# Patient Record
Sex: Female | Born: 1980 | Race: White | Hispanic: Yes | Marital: Single | Smoking: Never smoker
Health system: Southern US, Community
[De-identification: ages and names within clinical notes are randomized; demographics above are authoritative.]

## PROBLEM LIST (undated history)

## (undated) DIAGNOSIS — L409 Psoriasis, unspecified: Secondary | ICD-10-CM

## (undated) DIAGNOSIS — R519 Headache, unspecified: Secondary | ICD-10-CM

## (undated) DIAGNOSIS — Z87442 Personal history of urinary calculi: Secondary | ICD-10-CM

## (undated) HISTORY — DX: Headache, unspecified: R51.9

## (undated) HISTORY — DX: Personal history of urinary calculi: Z87.442

## (undated) HISTORY — DX: Psoriasis, unspecified: L40.9

## (undated) HISTORY — PX: OTHER SURGICAL HISTORY: SHX169

---

## 2004-06-29 ENCOUNTER — Emergency Department: Payer: Self-pay | Admitting: Emergency Medicine

## 2008-04-09 ENCOUNTER — Ambulatory Visit: Payer: Self-pay | Admitting: Family Medicine

## 2008-06-11 ENCOUNTER — Ambulatory Visit: Payer: Self-pay | Admitting: Surgery

## 2008-10-10 ENCOUNTER — Inpatient Hospital Stay: Payer: Self-pay | Admitting: Obstetrics and Gynecology

## 2010-07-27 ENCOUNTER — Emergency Department: Payer: Self-pay | Admitting: Internal Medicine

## 2013-05-24 ENCOUNTER — Ambulatory Visit: Payer: Self-pay | Admitting: Advanced Practice Midwife

## 2013-05-30 ENCOUNTER — Ambulatory Visit: Payer: Self-pay | Admitting: Advanced Practice Midwife

## 2013-09-18 ENCOUNTER — Inpatient Hospital Stay: Payer: Self-pay | Admitting: Obstetrics and Gynecology

## 2013-09-18 LAB — CBC WITH DIFFERENTIAL/PLATELET
BASOS ABS: 0.1 10*3/uL (ref 0.0–0.1)
Basophil %: 0.5 %
Eosinophil #: 0.1 10*3/uL (ref 0.0–0.7)
Eosinophil %: 0.7 %
HCT: 37.6 % (ref 35.0–47.0)
HGB: 12.7 g/dL (ref 12.0–16.0)
Lymphocyte #: 2.2 10*3/uL (ref 1.0–3.6)
Lymphocyte %: 19.8 %
MCH: 30.5 pg (ref 26.0–34.0)
MCHC: 33.6 g/dL (ref 32.0–36.0)
MCV: 91 fL (ref 80–100)
MONOS PCT: 7.6 %
Monocyte #: 0.8 x10 3/mm (ref 0.2–0.9)
NEUTROS PCT: 71.4 %
Neutrophil #: 7.8 10*3/uL — ABNORMAL HIGH (ref 1.4–6.5)
PLATELETS: 207 10*3/uL (ref 150–440)
RBC: 4.16 10*6/uL (ref 3.80–5.20)
RDW: 14.9 % — ABNORMAL HIGH (ref 11.5–14.5)
WBC: 10.9 10*3/uL (ref 3.6–11.0)

## 2013-09-19 LAB — HEMOGLOBIN: HGB: 11.3 g/dL — ABNORMAL LOW (ref 12.0–16.0)

## 2015-06-30 NOTE — L&D Delivery Note (Addendum)
Delivery Note EGA: 39.5 LMP: 08/23/15 EDC: 05/28/16  At 7:09, a viable female was delivered via spontaneous vaginal delivery (Presentation: LOA; cephalic).  APGAR: 9, 9; weight 8-9 .   Placenta status: sponatneous, intact.  Cord: 3 vessels,  with the following complications: none apparent.  Cord pH: not collected  Anesthesia:  none Episiotomy:  none Lacerations:  none Suture Repair: none Est. Blood Loss (mL): 100cc   Mom to postpartum.  Baby to Couplet care / Skin to Skin.  Mom presented with decreased fetal movement, and was found to have oligohydramnios with a total AFI of 2.2cm.  She was given a contraction stress test with no deleterious decelerations and was continued on pitocin for induction. She was AROM'd for clear fluid and in ~6730min was complete and pushed through 2 contractions to deliver.  Head followed by shoulders/body delivered without difficulty, and baby was placed on mom's chest.  Cord was doubly clamped and cut by FOB after a >60second delay, until pulseless.  Cord blood was collected and the placenta delivered spontaneously, intact.  IV pitocin was started for pph prophylaxis.  The baby was taken to the warmer as mom declined skin-to-skin.  I sang happy birthday to the as-of-yet-unnamed baby boy.  Chelsea C Ward 05/27/2016, 7:16 AM   Addendum:  Due to oligohydramnios, placenta sent to pathology

## 2016-01-20 ENCOUNTER — Other Ambulatory Visit: Payer: Self-pay | Admitting: Family Medicine

## 2016-01-20 DIAGNOSIS — Z3A36 36 weeks gestation of pregnancy: Secondary | ICD-10-CM

## 2016-01-20 DIAGNOSIS — Z3689 Encounter for other specified antenatal screening: Secondary | ICD-10-CM

## 2016-01-23 ENCOUNTER — Encounter: Payer: Self-pay | Admitting: Radiology

## 2016-01-23 ENCOUNTER — Ambulatory Visit
Admission: RE | Admit: 2016-01-23 | Discharge: 2016-01-23 | Disposition: A | Discharge status: Home / Self Care | Payer: Self-pay | Source: Ambulatory Visit | Attending: Family Medicine | Admitting: Family Medicine

## 2016-01-23 DIAGNOSIS — Z3A22 22 weeks gestation of pregnancy: Secondary | ICD-10-CM | POA: Insufficient documentation

## 2016-01-23 DIAGNOSIS — Z3689 Encounter for other specified antenatal screening: Secondary | ICD-10-CM

## 2016-01-23 DIAGNOSIS — Z36 Encounter for antenatal screening of mother: Secondary | ICD-10-CM | POA: Insufficient documentation

## 2016-03-09 LAB — OB RESULTS CONSOLE HIV ANTIBODY (ROUTINE TESTING): HIV: NONREACTIVE

## 2016-03-10 LAB — OB RESULTS CONSOLE RPR: RPR: NONREACTIVE

## 2016-05-07 LAB — OB RESULTS CONSOLE GBS: GBS: NEGATIVE

## 2016-05-20 LAB — OB RESULTS CONSOLE VARICELLA ZOSTER ANTIBODY, IGG: VARICELLA IGG: IMMUNE

## 2016-05-20 LAB — OB RESULTS CONSOLE RUBELLA ANTIBODY, IGM: RUBELLA: IMMUNE

## 2016-05-26 ENCOUNTER — Inpatient Hospital Stay
Admission: EM | Admit: 2016-05-26 | Discharge: 2016-05-28 | DRG: 775 | Disposition: A | Discharge status: Home / Self Care | Payer: Medicaid Other | Attending: Obstetrics & Gynecology | Admitting: Obstetrics & Gynecology

## 2016-05-26 DIAGNOSIS — Z3A39 39 weeks gestation of pregnancy: Secondary | ICD-10-CM

## 2016-05-26 DIAGNOSIS — O4100X0 Oligohydramnios, unspecified trimester, not applicable or unspecified: Secondary | ICD-10-CM | POA: Diagnosis present

## 2016-05-26 DIAGNOSIS — Z641 Problems related to multiparity: Secondary | ICD-10-CM

## 2016-05-26 DIAGNOSIS — O4103X0 Oligohydramnios, third trimester, not applicable or unspecified: Secondary | ICD-10-CM | POA: Diagnosis present

## 2016-05-26 DIAGNOSIS — O09529 Supervision of elderly multigravida, unspecified trimester: Secondary | ICD-10-CM

## 2016-05-26 DIAGNOSIS — O0993 Supervision of high risk pregnancy, unspecified, third trimester: Secondary | ICD-10-CM

## 2016-05-26 DIAGNOSIS — O368130 Decreased fetal movements, third trimester, not applicable or unspecified: Secondary | ICD-10-CM | POA: Diagnosis present

## 2016-05-26 LAB — CBC
HCT: 35.6 % (ref 35.0–47.0)
HEMOGLOBIN: 12.1 g/dL (ref 12.0–16.0)
MCH: 30.3 pg (ref 26.0–34.0)
MCHC: 34.1 g/dL (ref 32.0–36.0)
MCV: 88.8 fL (ref 80.0–100.0)
PLATELETS: 220 10*3/uL (ref 150–440)
RBC: 4.01 MIL/uL (ref 3.80–5.20)
RDW: 14.3 % (ref 11.5–14.5)
WBC: 9.6 10*3/uL (ref 3.6–11.0)

## 2016-05-26 LAB — TYPE AND SCREEN
ABO/RH(D): O POS
Antibody Screen: NEGATIVE

## 2016-05-26 MED ORDER — OXYTOCIN BOLUS FROM INFUSION
500.0000 mL | Freq: Once | INTRAVENOUS | Status: AC
  Administered 2016-05-27: 500 mL via INTRAVENOUS

## 2016-05-26 MED ORDER — SOD CITRATE-CITRIC ACID 500-334 MG/5ML PO SOLN: 30.0000 mL | ORAL | Status: DC | PRN

## 2016-05-26 MED ORDER — OXYTOCIN 40 UNITS IN LACTATED RINGERS INFUSION - SIMPLE MED
INTRAVENOUS | Status: AC
  Administered 2016-05-27: 500 mL via INTRAVENOUS
  Filled 2016-05-26: qty 1000

## 2016-05-26 MED ORDER — ONDANSETRON HCL 4 MG/2ML IJ SOLN: 4.0000 mg | Freq: Four times a day (QID) | INTRAMUSCULAR | Status: DC | PRN

## 2016-05-26 MED ORDER — LIDOCAINE HCL (PF) 1 % IJ SOLN
INTRAMUSCULAR | Status: AC
  Filled 2016-05-26: qty 30

## 2016-05-26 MED ORDER — OXYTOCIN 40 UNITS IN LACTATED RINGERS INFUSION - SIMPLE MED
1.0000 m[IU]/min | INTRAVENOUS | Status: DC
  Administered 2016-05-26: 1 m[IU]/min via INTRAVENOUS

## 2016-05-26 MED ORDER — AMMONIA AROMATIC IN INHA
RESPIRATORY_TRACT | Status: AC
  Filled 2016-05-26: qty 10

## 2016-05-26 MED ORDER — LIDOCAINE HCL (PF) 1 % IJ SOLN
30.0000 mL | INTRAMUSCULAR | Status: DC | PRN
Start: 2016-05-26 — End: 2016-05-28

## 2016-05-26 MED ORDER — LACTATED RINGERS IV SOLN: 500.0000 mL | INTRAVENOUS | Status: DC | PRN

## 2016-05-26 MED ORDER — BUTORPHANOL TARTRATE 1 MG/ML IJ SOLN: 1.0000 mg | INTRAMUSCULAR | Status: DC | PRN

## 2016-05-26 MED ORDER — ACETAMINOPHEN 325 MG PO TABS: 650.0000 mg | ORAL_TABLET | ORAL | Status: DC | PRN

## 2016-05-26 MED ORDER — LACTATED RINGERS IV SOLN
INTRAVENOUS | Status: DC
  Administered 2016-05-26 – 2016-05-27 (×2): via INTRAVENOUS

## 2016-05-26 MED ORDER — MISOPROSTOL 25 MCG QUARTER TABLET: 25.0000 ug | ORAL_TABLET | ORAL | Status: DC | PRN

## 2016-05-26 MED ORDER — TERBUTALINE SULFATE 1 MG/ML IJ SOLN
0.2500 mg | Freq: Once | INTRAMUSCULAR | Status: DC | PRN
Start: 2016-05-26 — End: 2016-05-28

## 2016-05-26 MED ORDER — OXYTOCIN 10 UNIT/ML IJ SOLN
INTRAMUSCULAR | Status: AC
  Filled 2016-05-26: qty 2

## 2016-05-26 MED ORDER — OXYTOCIN 40 UNITS IN LACTATED RINGERS INFUSION - SIMPLE MED
2.5000 [IU]/h | INTRAVENOUS | Status: DC
  Filled 2016-05-26: qty 1000

## 2016-05-26 MED ORDER — MISOPROSTOL 200 MCG PO TABS
ORAL_TABLET | ORAL | Status: AC
  Filled 2016-05-26: qty 4

## 2016-05-26 NOTE — H&P (Signed)
OB History & Physical   History of Present Illness:  Chief Complaint:   HPI:  Stacy Bauer is a 35 y.o. [id number] female at 1035w4d dated by LMP c/w 22wk US with EDC of 05/28/16.  She presents to L&D for decreased fetal movement x 2 days.     no CTX, no LOF, no VB  Pregnancy Issues: 1. AMA 2. Grand multiparity 3. Late entry to care 4. Elevated 1hr normal 3hr  Maternal Medical History:  No past medical history on file.  No past surgical history on file.  No Known Allergies  Prior to Admission medications   Not on File     [facility]   Social History: She denies etoh, tobacco, illicit drug use.  Lives at home with her 4 children and husband    Family History: family history is not on file. no GYN cancers  Review of Systems: A full review of systems was performed and negative except as noted in the HPI.     Physical Exam:  Vital Signs: Resp 20   Ht 5\' 1"  (1.549 m)   Wt 76.2 kg (168 lb)   BMI 31.74 kg/m  General: no acute distress.  HEENT: normocephalic, atraumatic Heart: regular rate & rhythm.  No murmurs/rubs/gallops Lungs: clear to auscultation bilaterally, normal respiratory effort Abdomen: soft, gravid, non-tender;  EFW: 7.8 Pelvic:   External: Normal external female genitalia  Cervix: Dilation: 3 / Effacement (%): 40 /   -3   Extremities: non-tender, symmetric, 1+ edema bilaterally.  DTRs: 2+ Neurologic: Alert & oriented x 3.    No results found for this or any previous visit (from the past 24 hour(s)).  Pertinent Results:  Prenatal Labs: Blood type/Rh O+  Antibody screen neg  Rubella Immune  Varicella Immune  RPR NR  HBsAg Neg  HIV NR  GC neg  Chlamydia neg  Genetic screening Negative quad screen  1 hour GTT 147  3 hour GTT WNL  GBS Neg 05/07/16  TDAP 03/09/16  FLU declined FHT:  140 mod + accels + variable decel x111min with return to baseline following repositioning TOCO: quiet SVE:  Dilation: 3 /  Effacement (%): 40 /   -3, posterior, medium   Ultrasound: Cephalic presentation AFI = 2.2cm Scant fluid seen in 3/4 quadrants, with cord in each fluid pocket; immeasurable quantity  Assessment:  Stacy Bauer is a 35 y.o. [phone] female at 2835w4d with oligohydramnios at term for IOL.   Plan:  1. Admit to Labor & Delivery 2. CBC, T&S, Clrs, IVF 3. GBS negative   4. Consents obtained. 5. Continuous efm/toco 6. Category 2 strip, no evidence for acidemia.   7. Contraction stress test with pitocin to start.  If baby tolerates/Category 1 strip, will consider cytotec.  Otherwise will induce with pitocin.  Discussed plan with patient and explained the risk of oligohydramnios carries with it a risk of emergency cesarean due to fetal distress.    ----- Ranae Plumberhelsea Zamani Crocker, MD Attending Obstetrician and Gynecologist [facility], Department of OB/GYN [facility]

## 2016-05-27 MED ORDER — TETANUS-DIPHTH-ACELL PERTUSSIS 5-2.5-18.5 LF-MCG/0.5 IM SUSP: 0.5000 mL | Freq: Once | INTRAMUSCULAR | Status: DC

## 2016-05-27 MED ORDER — IBUPROFEN 600 MG PO TABS
600.0000 mg | ORAL_TABLET | Freq: Four times a day (QID) | ORAL | Status: DC
  Administered 2016-05-27 – 2016-05-28 (×4): 600 mg via ORAL
  Filled 2016-05-27 (×4): qty 1

## 2016-05-27 MED ORDER — ACETAMINOPHEN 500 MG PO TABS
1000.0000 mg | ORAL_TABLET | Freq: Four times a day (QID) | ORAL | Status: DC | PRN
  Administered 2016-05-27: 1000 mg via ORAL
  Filled 2016-05-27: qty 2

## 2016-05-27 MED ORDER — IBUPROFEN 600 MG PO TABS
ORAL_TABLET | ORAL | Status: AC
  Administered 2016-05-27: 600 mg
  Filled 2016-05-27: qty 1

## 2016-05-27 MED ORDER — WITCH HAZEL-GLYCERIN EX PADS: 1.0000 "application " | MEDICATED_PAD | CUTANEOUS | Status: DC | PRN

## 2016-05-27 MED ORDER — PRENATAL MULTIVITAMIN CH
1.0000 | ORAL_TABLET | Freq: Every day | ORAL | Status: DC
  Administered 2016-05-27 – 2016-05-28 (×2): 1 via ORAL
  Filled 2016-05-27 (×2): qty 1

## 2016-05-27 MED ORDER — ONDANSETRON HCL 4 MG PO TABS: 4.0000 mg | ORAL_TABLET | ORAL | Status: DC | PRN

## 2016-05-27 MED ORDER — DOCUSATE SODIUM 100 MG PO CAPS
100.0000 mg | ORAL_CAPSULE | Freq: Two times a day (BID) | ORAL | Status: DC
  Administered 2016-05-27 – 2016-05-28 (×2): 100 mg via ORAL
  Filled 2016-05-27 (×2): qty 1

## 2016-05-27 MED ORDER — DIBUCAINE 1 % RE OINT: 1.0000 "application " | TOPICAL_OINTMENT | RECTAL | Status: DC | PRN

## 2016-05-27 MED ORDER — SIMETHICONE 80 MG PO CHEW: 80.0000 mg | CHEWABLE_TABLET | ORAL | Status: DC | PRN

## 2016-05-27 MED ORDER — INFLUENZA VAC SPLIT QUAD 0.5 ML IM SUSY: 0.5000 mL | PREFILLED_SYRINGE | INTRAMUSCULAR | Status: DC | PRN

## 2016-05-27 MED ORDER — COCONUT OIL OIL: 1.0000 "application " | TOPICAL_OIL | Status: DC | PRN

## 2016-05-27 MED ORDER — ONDANSETRON HCL 4 MG/2ML IJ SOLN: 4.0000 mg | INTRAMUSCULAR | Status: DC | PRN

## 2016-05-27 MED ORDER — DIPHENHYDRAMINE HCL 25 MG PO CAPS: 25.0000 mg | ORAL_CAPSULE | Freq: Four times a day (QID) | ORAL | Status: DC | PRN

## 2016-05-27 NOTE — Procedures (Signed)
Bedside ultrasound performed:  EGA: 39.4 days LMP: 08/23/15 EDC: 05/28/16 (per records)  Fetal position:  Cephalic, spine maternal right Placenta: anterior, fundal  AFI: Q1 left upper quadrant: 2.2cm Q2: right upper quadrant: 0cm Q3: right lowe quadrant: 0cm Q4: left lower quadrant: 0cm (small pockets, each containing cord)  Total: 2.2cm  Diagnosis: oligohydramnios at term  ----- Ranae Plumberhelsea Jelitza Manninen, MD Attending Obstetrician and Gynecologist [facility], Department of OB/GYN [facility]

## 2016-05-27 NOTE — Progress Notes (Signed)
Intrapartum progress note   S: comfortable, having stronger contractions.  O: Temp 98.1 F (36.7 C) (Oral)   Resp 18   Ht 5\' 1"  (1.549 m)   Wt 76.2 kg (168 lb)   BMI 31.74 kg/m   FHT: 135 mod + accels +early decelerations TOCO: q2-634min on pitocin @ 7112mu/min SVE: 5/90/-1 AROM'd for clear fluid  A/P: 35yo [id number] @ 39.5 with IOL for oligohydramnios at term  1. IUP: category 1 tracing 2. IOL: continue pitocin, titrate to q2-393min contractions  ----- Stacy Plumberhelsea Diana Davenport, MD Attending Obstetrician and Gynecologist [facility], Department of OB/GYN [facility]

## 2016-05-27 NOTE — Discharge Summary (Signed)
Obstetrical Discharge Summary  Patient Name: Stacy Bauer DOB: 1980/07/27 MRN: [id number]  Date of Admission: 05/26/2016 Date of Discharge: 05/28/16 Primary OB:  [facility]   Gestational Age at Delivery: 439w5d   Antepartum complications: AMA, grand multiparity, late entry to care, abnormal 1hr GTT, oligohydramnios  Admitting Diagnosis: IOL for oligohydramnios   Secondary Diagnosis: Patient Active Problem List   Diagnosis Date Noted  . Normal vaginal delivery of fifth pregnancy 05/27/2016  . Labor and delivery, indication for care 05/26/2016  . Oligohydramnios antepartum 05/26/2016  . High-risk pregnancy in third trimester 05/26/2016  . Advanced maternal age in multigravida 05/26/2016  . Grand multiparity 05/26/2016    Augmentation: AROM and Pitocin Complications: None Intrapartum complications/course: Mom presented with decreased fetal movement, and was found to have oligohydramnios with a total AFI of 2.2cm.  She was given a contraction stress test with no deleterious decelerations and was continued on pitocin for induction. She was AROM'd for clear fluid and in ~4030min was complete and pushed through 2 contractions to deliver.  Head followed by shoulders/body delivered without difficulty, and baby was placed on mom's chest.  Cord was doubly clamped and cut by FOB after a >60second delay, until pulseless.  Cord blood was collected and the placenta delivered spontaneously, intact.  IV pitocin was started for pph prophylaxis.  Date of Delivery: 05/27/2016 Delivered By: Stacy Bauer Delivery Type: spontaneous vaginal delivery Anesthesia: none Placenta: sponatneous Laceration: none Episiotomy: none Newborn Data: Live born female Birth Weight:  8lb 9oz APGAR: 9, 9    Postpartum Procedures: none  Post partum course:  Patient had an uncomplicated postpartum course.  By time of discharge on PPD#1, her pain was controlled on oral pain medications; she had appropriate lochia and  was ambulating, voiding without difficulty and tolerating regular diet.  She was deemed stable for discharge to home.     Discharge Physical Exam: 05/28/16 Temp 98.2 F (36.8 C)   Resp 17   Ht 5\' 1"  (1.549 m)   Wt 76.2 kg (168 lb)   BMI 31.74 kg/m   General: NAD CV: RRR Pulm: CTABL, nl effort ABD: s/nd/nt, fundus firm and below the umbilicus Lochia: moderate DVT Evaluation: LE non-ttp, no evidence of DVT on exam.  Hemoglobin  Date Value Ref Range Status  05/26/2016 12.1 12.0 - 16.0 g/dL Final   HGB  Date Value Ref Range Status  09/19/2013 11.3 (L) 12.0 - 16.0 g/dL Final   HCT  Date Value Ref Range Status  05/26/2016 35.6 35.0 - 47.0 % Final  09/18/2013 37.6 35.0 - 47.0 % Final     Disposition: stable, discharge to home. Baby Feeding: breastmilk andformula Baby Disposition: home with mom  Rh Immune globulin given: n/a Rubella vaccine given: n/a Tdap vaccine given in AP or PP setting: AP Flu vaccine given in AP or PP setting: declined  Contraception: Paraguard  Prenatal Labs:  Blood type/Rh O+  Antibody screen neg  Rubella Immune  Varicella Immune  RPR NR  HBsAg Neg  HIV NR  GC neg  Chlamydia neg  Genetic screening Negative quad screen  1 hour GTT 147  3 hour GTT WNL  GBS Neg 05/07/16  TDAP 03/09/16  FLU declined    Plan:  Stacy Bauer was discharged to home in good condition. Follow-up appointment at [facility] in 6 week and Paragard IUD.    Discharge Medications:   Medication List    TAKE these medications   ibuprofen 600 MG tablet Commonly known as:  ADVIL,MOTRIN  Take 1 tablet (600 mg total) by mouth every 6 (six) hours.      Discharge Instructions    Activity as tolerated    Complete by:  As directed    Call MD for:    Complete by:  As directed    Signs and symptoms of postpartum depression.  If you develop suicidal or homicidal thoughts, go to ED.   Call MD for:  difficulty breathing, headache or visual disturbances    Complete  by:  As directed    Call MD for:  extreme fatigue    Complete by:  As directed    Call MD for:  hives    Complete by:  As directed    Call MD for:  persistant dizziness or light-headedness    Complete by:  As directed    Call MD for:  persistant nausea and vomiting    Complete by:  As directed    Call MD for:  redness, tenderness, or signs of infection (pain, swelling, redness, odor or green/yellow discharge around incision site)    Complete by:  As directed    Call MD for:  severe uncontrolled pain    Complete by:  As directed    Call MD for:  temperature >100.4    Complete by:  As directed    Diet - low sodium heart healthy    Complete by:  As directed    Sexual acrtivity    Complete by:  As directed    NO intercourse until 6 weeks Postpartum after IUD placement       Signed: Carlean JewsMeredith Sigmon, CNM

## 2016-05-27 NOTE — Progress Notes (Signed)
[facility] With ambulation to the BR for post delivery void.  Pt. Voided moderate amount of red tinge urine, pericare given. Pt. And infant ready for transfer to [facility] room #336. Infant transferred via basinet and patient transferred via WC.

## 2016-05-27 NOTE — Progress Notes (Signed)
Patient has declined need for interpreter all day .

## 2016-05-27 NOTE — Progress Notes (Signed)
Pt. Currently eating regular diet.

## 2016-05-27 NOTE — Discharge Instructions (Signed)

## 2016-05-28 LAB — CBC
HCT: 30.9 % — ABNORMAL LOW (ref 35.0–47.0)
Hemoglobin: 10.8 g/dL — ABNORMAL LOW (ref 12.0–16.0)
MCH: 32.1 pg (ref 26.0–34.0)
MCHC: 34.8 g/dL (ref 32.0–36.0)
MCV: 92.2 fL (ref 80.0–100.0)
PLATELETS: 182 10*3/uL (ref 150–440)
RBC: 3.35 MIL/uL — ABNORMAL LOW (ref 3.80–5.20)
RDW: 14.2 % (ref 11.5–14.5)
WBC: 11.4 10*3/uL — ABNORMAL HIGH (ref 3.6–11.0)

## 2016-05-28 LAB — SURGICAL PATHOLOGY

## 2016-05-28 LAB — RPR: RPR Ser Ql: NONREACTIVE

## 2016-05-28 MED ORDER — IBUPROFEN 600 MG PO TABS: 600.0000 mg | ORAL_TABLET | Freq: Four times a day (QID) | ORAL | 0 refills | Status: DC

## 2016-05-28 NOTE — Progress Notes (Signed)
Pt discharged home with infant.  Discharge instructions and follow up appointment given to and reviewed with pt.  Pt verbalized understanding.  Escorted by auxillary. 

## 2016-06-24 ENCOUNTER — Emergency Department
Admission: EM | Admit: 2016-06-24 | Discharge: 2016-06-24 | Disposition: A | Discharge status: Home / Self Care | Payer: No Typology Code available for payment source | Attending: Emergency Medicine | Admitting: Emergency Medicine

## 2016-06-24 ENCOUNTER — Encounter: Payer: Self-pay | Admitting: Emergency Medicine

## 2016-06-24 ENCOUNTER — Emergency Department: Payer: No Typology Code available for payment source

## 2016-06-24 DIAGNOSIS — F0781 Postconcussional syndrome: Secondary | ICD-10-CM | POA: Insufficient documentation

## 2016-06-24 DIAGNOSIS — S0990XA Unspecified injury of head, initial encounter: Secondary | ICD-10-CM | POA: Diagnosis present

## 2016-06-24 DIAGNOSIS — R51 Headache: Secondary | ICD-10-CM | POA: Insufficient documentation

## 2016-06-24 DIAGNOSIS — Y9389 Activity, other specified: Secondary | ICD-10-CM | POA: Insufficient documentation

## 2016-06-24 DIAGNOSIS — Y999 Unspecified external cause status: Secondary | ICD-10-CM | POA: Insufficient documentation

## 2016-06-24 DIAGNOSIS — Y92410 Unspecified street and highway as the place of occurrence of the external cause: Secondary | ICD-10-CM | POA: Diagnosis not present

## 2016-06-24 LAB — POCT PREGNANCY, URINE: Preg Test, Ur: NEGATIVE

## 2016-06-24 MED ORDER — METOCLOPRAMIDE HCL 10 MG PO TABS
10.0000 mg | ORAL_TABLET | Freq: Once | ORAL | Status: AC
  Administered 2016-06-24: 10 mg via ORAL
  Filled 2016-06-24: qty 1

## 2016-06-24 MED ORDER — ACETAMINOPHEN-CODEINE #3 300-30 MG PO TABS
1.0000 | ORAL_TABLET | Freq: Once | ORAL | Status: AC
  Administered 2016-06-24: 1 via ORAL
  Filled 2016-06-24: qty 1

## 2016-06-24 MED ORDER — METOCLOPRAMIDE HCL 5 MG PO TABS: 5.0000 mg | ORAL_TABLET | Freq: Three times a day (TID) | ORAL | 0 refills | Status: DC | PRN

## 2016-06-24 MED ORDER — ACETAMINOPHEN-CODEINE #3 300-30 MG PO TABS: 1.0000 | ORAL_TABLET | Freq: Three times a day (TID) | ORAL | 0 refills | Status: DC | PRN

## 2016-06-24 MED ORDER — DIPHENHYDRAMINE HCL 25 MG PO CAPS
25.0000 mg | ORAL_CAPSULE | Freq: Once | ORAL | Status: AC
  Administered 2016-06-24: 25 mg via ORAL
  Filled 2016-06-24: qty 1

## 2016-06-24 MED ORDER — NAPROXEN 500 MG PO TABS
500.0000 mg | ORAL_TABLET | Freq: Once | ORAL | Status: AC
  Administered 2016-06-24: 500 mg via ORAL
  Filled 2016-06-24: qty 1
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
## 2020-01-24 ENCOUNTER — Encounter: Payer: Self-pay | Admitting: Emergency Medicine

## 2020-01-24 ENCOUNTER — Other Ambulatory Visit: Payer: Self-pay

## 2020-01-24 ENCOUNTER — Ambulatory Visit
Admission: EM | Admit: 2020-01-24 | Discharge: 2020-01-24 | Disposition: A | Discharge status: Home / Self Care | Payer: BC Managed Care – PPO | Attending: Emergency Medicine | Admitting: Emergency Medicine

## 2020-01-24 DIAGNOSIS — B3731 Acute candidiasis of vulva and vagina: Secondary | ICD-10-CM

## 2020-01-24 DIAGNOSIS — B9689 Other specified bacterial agents as the cause of diseases classified elsewhere: Secondary | ICD-10-CM | POA: Diagnosis present

## 2020-01-24 DIAGNOSIS — B373 Candidiasis of vulva and vagina: Secondary | ICD-10-CM | POA: Diagnosis not present

## 2020-01-24 DIAGNOSIS — N200 Calculus of kidney: Secondary | ICD-10-CM | POA: Diagnosis present

## 2020-01-24 DIAGNOSIS — N760 Acute vaginitis: Secondary | ICD-10-CM | POA: Diagnosis present

## 2020-01-24 LAB — URINALYSIS, COMPLETE (UACMP) WITH MICROSCOPIC
Bacteria, UA: NONE SEEN
Bilirubin Urine: NEGATIVE
Glucose, UA: NEGATIVE mg/dL
Ketones, ur: NEGATIVE mg/dL
Nitrite: NEGATIVE
Protein, ur: NEGATIVE mg/dL
Specific Gravity, Urine: 1.025 (ref 1.005–1.030)
pH: 6 (ref 5.0–8.0)

## 2020-01-24 LAB — WET PREP, GENITAL
Sperm: NONE SEEN
Trich, Wet Prep: NONE SEEN

## 2020-01-24 MED ORDER — FLUCONAZOLE 150 MG PO TABS: 150.0000 mg | ORAL_TABLET | Freq: Once | ORAL | 1 refills | Status: AC

## 2020-01-24 MED ORDER — TAMSULOSIN HCL 0.4 MG PO CAPS: 0.4000 mg | ORAL_CAPSULE | Freq: Every day | ORAL | 0 refills | Status: AC

## 2020-01-24 MED ORDER — METRONIDAZOLE 500 MG PO TABS: 500.0000 mg | ORAL_TABLET | Freq: Two times a day (BID) | ORAL | 0 refills | Status: AC

## 2020-01-24 MED ORDER — IBUPROFEN 600 MG PO TABS: 600.0000 mg | ORAL_TABLET | Freq: Four times a day (QID) | ORAL | 0 refills | Status: AC | PRN

## 2020-01-24 NOTE — ED Triage Notes (Signed)
Patient c/o back pain that started 1 week ago. She also reports hematuria.

## 2020-01-24 NOTE — ED Provider Notes (Signed)
HPI  SUBJECTIVE:  Stacy Bauer is a 39 y.o. female who presents with constant, squeezing, throbbing left low back pain that radiates into her suprapubic region for the past week.  She states that she occasionally has right-sided low back pain.  She reports urinary frequency, odorous urine.  An episode of hematuria yesterday.  She states that she is seeing particles similar to sand in her urine.  No nausea,, fevers, dysuria, urinary urgency, cloudy urine.  She also reports vaginal itching, occasional labial erythema, edema secondary to the itching, and white vaginal discharge.  No vaginal bleeding, odor, rash.  She states that she has not had intercourse since September 2020.  No Aggravating or alleviating factors.  She has not tried anything for this.  No recent antibiotics.  She has never had back pain or vaginal discharge like this in the past.  Past medical history negative for UTI, pyelonephritis, nephrolithiasis, gonorrhea, chlamydia, HIV, HSV, syphilis, trichomonas, BV, yeast, diabetes, hypertension, PID.  LMP: 7/1.  Denies the possibility of being pregnant.  PMD: None.   History reviewed. No pertinent past medical history.  History reviewed. No pertinent surgical history.  History reviewed. No pertinent family history.  Social History   Tobacco Use  . Smoking status: Never Smoker  . Smokeless tobacco: Never Used  Substance Use Topics  . Alcohol use: No  . Drug use: No    No current facility-administered medications for this encounter.  Current Outpatient Medications:  .  fluconazole (DIFLUCAN) 150 MG tablet, Take 1 tablet (150 mg total) by mouth once for 1 dose. 1 tab po x 1. May repeat in 72 hours if no improvement, Disp: 2 tablet, Rfl: 1 .  ibuprofen (ADVIL) 600 MG tablet, Take 1 tablet (600 mg total) by mouth every 6 (six) hours as needed., Disp: 30 tablet, Rfl: 0 .  metroNIDAZOLE (FLAGYL) 500 MG tablet, Take 1 tablet (500 mg total) by mouth 2 (two) times daily for 7  days., Disp: 14 tablet, Rfl: 0 .  tamsulosin (FLOMAX) 0.4 MG CAPS capsule, Take 1 capsule (0.4 mg total) by mouth at bedtime for 7 days., Disp: 7 capsule, Rfl: 0  No Known Allergies   ROS  As noted in HPI.   Physical Exam  BP (!) 135/100 (BP Location: Right Arm)   Pulse 88   Temp 98 F (36.7 C) (Oral)   Resp 18   Ht 5\' 1"  (1.549 m)   Wt 72.6 kg   LMP 01/03/2020   SpO2 97%   BMI 30.23 kg/m   Constitutional: Well developed, well nourished, no acute distress Eyes:  EOMI, conjunctiva normal bilaterally HENT: Normocephalic, atraumatic,mucus membranes moist Respiratory: Normal inspiratory effort Cardiovascular: Normal rate GI: nondistended soft.  Positive suprapubic, left UVJ tenderness.  Right flank tenderness.  No right lower quadrant, left lower quadrant tenderness.  Active bowel sounds.  No guarding, rebound. Back: Left CVA tenderness skin: No rash, skin intact Musculoskeletal: no deformities Neurologic: Alert & oriented x 3, no focal neuro deficits Psychiatric: Speech and behavior appropriate   ED Course   Medications - No data to display  Orders Placed This Encounter  Procedures  . Wet prep, genital    Standing Status:   Standing    Number of Occurrences:   1  . Urinalysis, Complete w Microscopic    Standing Status:   Standing    Number of Occurrences:   1  . Strain all urine    Send pt home with urine strainer  Results for orders placed or performed during the hospital encounter of 01/24/20 (from the past 24 hour(s))  Urinalysis, Complete w Microscopic     Status: Abnormal   Collection Time: 01/24/20  3:34 PM  Result Value Ref Range   Color, Urine YELLOW YELLOW   APPearance CLEAR CLEAR   Specific Gravity, Urine 1.025 1.005 - 1.030   pH 6.0 5.0 - 8.0   Glucose, UA NEGATIVE NEGATIVE mg/dL   Hgb urine dipstick MODERATE (A) NEGATIVE   Bilirubin Urine NEGATIVE NEGATIVE   Ketones, ur NEGATIVE NEGATIVE mg/dL   Protein, ur NEGATIVE NEGATIVE mg/dL    Nitrite NEGATIVE NEGATIVE   Leukocytes,Ua TRACE (A) NEGATIVE   Squamous Epithelial / LPF 6-10 0 - 5   WBC, UA 0-5 0 - 5 WBC/hpf   RBC / HPF 6-10 0 - 5 RBC/hpf   Bacteria, UA NONE SEEN NONE SEEN  Wet prep, genital     Status: Abnormal   Collection Time: 01/24/20  4:09 PM   Specimen: Vaginal; Genital  Result Value Ref Range   Yeast Wet Prep HPF POC PRESENT (A) NONE SEEN   Trich, Wet Prep NONE SEEN NONE SEEN   Clue Cells Wet Prep HPF POC PRESENT (A) NONE SEEN   WBC, Wet Prep HPF POC FEW (A) NONE SEEN   Sperm NONE SEEN    No results found.  ED Clinical Impression  1. Nephrolithiasis   2. BV (bacterial vaginosis)   3. Vaginal yeast infection      ED Assessment/Plan  H&P, UA consistent with nephrolithiasis.  Do not think that this is an obstructing nephrolithiasis as she states that she is passing stones and appears comfortable.  She is afebrile, has not taken antipyretic in the past 6 hours and reports no history of fevers.  Therefore deferring imaging today.  This was a contaminated specimen, there are no bacteria in it, so do not think that this needs to go off for culture.  She is also reporting vaginal discharge, so will get a wet prep.  Do not think that we need to test for  gonorrhea chlamydia as she states that she has not been sexually active since September 2020.  Home with Tylenol/ibuprofen, Flomax, strainer.  Urology follow-up as she currently has no PMD.  Dr. Apolinar Junes on call.  Wet prep also positive for BV and yeast.  Will send home with Flagyl and Diflucan.   will also provide a primary care list for ongoing care.  Discussed labs,  MDM, treatment plan, and plan for follow-up with patient. Discussed sn/sx that should prompt return to the ED. patient agrees with plan.   Meds ordered this encounter  Medications  . metroNIDAZOLE (FLAGYL) 500 MG tablet    Sig: Take 1 tablet (500 mg total) by mouth 2 (two) times daily for 7 days.    Dispense:  14 tablet    Refill:  0  .  fluconazole (DIFLUCAN) 150 MG tablet    Sig: Take 1 tablet (150 mg total) by mouth once for 1 dose. 1 tab po x 1. May repeat in 72 hours if no improvement    Dispense:  2 tablet    Refill:  1  . tamsulosin (FLOMAX) 0.4 MG CAPS capsule    Sig: Take 1 capsule (0.4 mg total) by mouth at bedtime for 7 days.    Dispense:  7 capsule    Refill:  0  . ibuprofen (ADVIL) 600 MG tablet    Sig: Take 1 tablet (600 mg  total) by mouth every 6 (six) hours as needed.    Dispense:  30 tablet    Refill:  0    *This clinic note was created using Scientist, clinical (histocompatibility and immunogenetics). Therefore, there may be occasional mistakes despite careful proofreading.   ?    Domenick Gong, MD 01/26/20 [phone]

## 2020-01-24 NOTE — Discharge Instructions (Addendum)
I suspect that you have kidney stones causing your back pain and the blood in your urine.  You may take 600 mg of ibuprofen combined with 1000 mg of Tylenol 3-4 times a day as needed for pain.  Drink extra fluids.  Strain all your urine.  The Flomax will help you pass any additional stones.  Follow-up with urology within the week to make sure that you are getting better.  Go immediately to the ER for fevers above 100.4, pain not controlled with Tylenol/ibuprofen, or any other concerns.  You also have BV and a vaginal yeast infection.  I am treating the BV with Flagyl and the yeast infection with Diflucan.  Make sure you finish all the medication, even if you feel better.  Here is a list of primary care providers who are taking new patients:  Dr. Elizabeth Sauer [address] [place] [address] [phone]  [facility] [address] [place], [place] [address] [phone]  [facility] [place] [address] [place] [address]  [phone]  [facility] [address] [place], [place] [address] [phone]  [facility] [address]  [phone] [place], [place] [address]  Go to www.goodrx.com to look up your medications. This will give you a list of where you can find your prescriptions at the most affordable prices. Or ask the pharmacist what the cash price is, or if they have any other discount programs available to help make your medication more affordable. This can be less expensive than what you would pay with insurance.

## 2020-08-07 ENCOUNTER — Encounter: Payer: Self-pay | Admitting: Advanced Practice Midwife

## 2020-08-07 ENCOUNTER — Other Ambulatory Visit: Payer: Self-pay

## 2020-08-07 ENCOUNTER — Ambulatory Visit (LOCAL_COMMUNITY_HEALTH_CENTER): Payer: Self-pay | Admitting: Advanced Practice Midwife

## 2020-08-07 VITALS — BP 121/77 | HR 60 | Ht 64.0 in | Wt 162.4 lb

## 2020-08-07 DIAGNOSIS — E663 Overweight: Secondary | ICD-10-CM

## 2020-08-07 DIAGNOSIS — Z30431 Encounter for routine checking of intrauterine contraceptive device: Secondary | ICD-10-CM

## 2020-08-07 DIAGNOSIS — Z3009 Encounter for other general counseling and advice on contraception: Secondary | ICD-10-CM

## 2020-08-07 LAB — WET PREP FOR TRICH, YEAST, CLUE: Trichomonas Exam: NEGATIVE

## 2020-08-07 MED ORDER — CLOTRIMAZOLE 1 % VA CREA: 1.0000 | TOPICAL_CREAM | Freq: Every day | VAGINAL | 0 refills | Status: DC

## 2020-08-07 NOTE — Progress Notes (Signed)
Presents for PE and PAP. Reports vaginal symptoms. Wet Prep results reviewed. Treated for yeast per verbal order by E. Sciora. Sharlyne Pacas, RN

## 2020-08-07 NOTE — Addendum Note (Signed)
Addended by: Sharlyne Pacas on: 08/07/2020 04:56 PM   Modules accepted: Orders

## 2020-08-07 NOTE — Progress Notes (Signed)
[facility] [address] Main Number: [phone]    Family Planning Visit- Initial Visit  Subjective:  Stacy Bauer is a 40 y.o. SHF [id number](22,17,11,6,4) nonsmoker   being seen today for an initial well woman visit and to discuss family planning options.  She is currently using IUD Mirena for pregnancy prevention. Patient reports she does not want a pregnancy in the next year.  Patient has the following medical conditions has Overweight BMI=27.8 on their problem list.  Chief Complaint  Patient presents with  . Gynecologic Exam    Patient reports last pap 07/29/2015 ASCUS HPV neg with no f/u.  Mirena inserted 07/07/16.  Denies cigs, vaping.  Last ETOH 07/04/20 (2 beers) q 2 mo. Not working. Living with 5 children.  Last sex 2 years ago.  LMP 07/12/20.  Patient denies cigs, vaping, MJ.  Body mass index is 27.88 kg/m. - Patient is eligible for diabetes screening based on BMI and age >45?  not applicable HA1C ordered? not applicable  Patient reports 0  partner/s in last year. Desires STI screening?  Yes  Has patient been screened once for HCV in the past?  No  No results found for: HCVAB  Does the patient have current drug use (including MJ), have a partner with drug use, and/or has been incarcerated since last result? No  If yes-- Screen for HCV through [facility]   Does the patient meet criteria for HBV testing? No  Criteria:  -Household, sexual or needle sharing contact with HBV -History of drug use -HIV positive -Those with known Hep C   Health Maintenance Due  Topic Date Due  . Hepatitis C Screening  Never done  . COVID-19 Vaccine (1) Never done  . HIV Screening  Never done  . TETANUS/TDAP  Never done  . PAP SMEAR-Modifier  Never done  . INFLUENZA VACCINE  Never done    Review of Systems  Eyes: Positive for blurred vision (intermittently 1x/mo onset last summer; never had eye exam--urged to go).   All other systems reviewed and are negative.   The following portions of the patient's history were reviewed and updated as appropriate: allergies, current medications, past family history, past medical history, past social history, past surgical history and problem list. Problem list updated.   See flowsheet for other program required questions.  Objective:   Vitals:   08/07/20 1500  BP: 121/77  Pulse: 60  Weight: 162 lb 6.4 oz (73.7 kg)  Height: 5\' 4"  (1.626 m)    Physical Exam Constitutional:      Appearance: Normal appearance. She is normal weight.  HENT:     Head: Normocephalic and atraumatic.     Mouth/Throat:     Mouth: Mucous membranes are moist.  Eyes:     Conjunctiva/sclera: Conjunctivae normal.  Neck:     Comments: Thyroid without masses or tenderness Cardiovascular:     Rate and Rhythm: Normal rate and regular rhythm.  Pulmonary:     Effort: Pulmonary effort is normal.     Breath sounds: Normal breath sounds.  Chest:  Breasts:     Right: Normal.     Left: Normal.    Abdominal:     Palpations: Abdomen is soft.     Comments: Soft fair tone without masses or tenderness  Genitourinary:    General: Normal vulva.     Exam position: Lithotomy position.     Vagina: Vaginal discharge (thick clumpy white leukorrhea, ph<4.5)  present.     Cervix: Friability (friable to pap; IUD string visible) present.     Uterus: Normal.      Adnexa: Right adnexa normal and left adnexa normal.     Rectum: Normal.  Musculoskeletal:        General: Normal range of motion.     Cervical back: Normal range of motion and neck supple.  Skin:    General: Skin is warm and dry.  Neurological:     Mental Status: She is alert.  Psychiatric:        Mood and Affect: Mood normal.       Assessment and Plan:  Stacy Bauer is a 40 y.o. female presenting to the [facility] Department for an initial well woman exam/family planning visit  Contraception counseling:  Reviewed all forms of birth control options in the tiered based approach. available including abstinence; over the counter/barrier methods; hormonal contraceptive medication including pill, patch, ring, injection,contraceptive implant, ECP; hormonal and nonhormonal IUDs; permanent sterilization options including vasectomy and the various tubal sterilization modalities. Risks, benefits, and typical effectiveness rates were reviewed.  Questions were answered.  Written information was also given to the patient to review.  Patient desires continuation of Mirena, this was prescribed for patient. She will follow up in  1 year for surveillance.  She was told to call with any further questions, or with any concerns about this method of contraception.  Emphasized use of condoms 100% of the time for STI prevention.  Patient was not offered ECP. ECP was not accepted by the patient. ECP counseling was not given - see RN documentation  1. Overweight BMI=27.8   2. Family planning Treat for yeast per standing orders please Treat wet mount per standing orders Immunization nurse consult - WET PREP FOR TRICH, YEAST, CLUE - IGP, Aptima HPV - Chlamydia/Gonorrhea [facility]  3. Encounter for routine checking of intrauterine contraceptive device (IUD) IUD in situ     No follow-ups on file.  No future appointments.  Alberteen Spindle, CNM

## 2020-08-08 ENCOUNTER — Telehealth: Payer: Self-pay

## 2020-08-08 NOTE — Telephone Encounter (Signed)
Per spanish interpreter erica id [phone] patient had moderna vaccine 1st dose on 02-07-2020, 2nd dose 03-07-2020 and moderna booster on 08-07-2020

## 2020-08-13 LAB — IGP, APTIMA HPV
HPV Aptima: NEGATIVE
PAP Smear Comment: 0

## 2020-08-14 ENCOUNTER — Encounter: Payer: Self-pay | Admitting: Advanced Practice Midwife

## 2020-08-14 DIAGNOSIS — R87619 Unspecified abnormal cytological findings in specimens from cervix uteri: Secondary | ICD-10-CM

## 2020-08-14 HISTORY — DX: Unspecified abnormal cytological findings in specimens from cervix uteri: R87.619

## 2021-07-10 ENCOUNTER — Other Ambulatory Visit: Payer: Self-pay

## 2021-07-10 ENCOUNTER — Ambulatory Visit (LOCAL_COMMUNITY_HEALTH_CENTER): Payer: Self-pay | Admitting: Family Medicine

## 2021-07-10 VITALS — BP 136/83 | Ht 64.0 in | Wt 166.8 lb

## 2021-07-10 DIAGNOSIS — L400 Psoriasis vulgaris: Secondary | ICD-10-CM

## 2021-07-10 DIAGNOSIS — Z3009 Encounter for other general counseling and advice on contraception: Secondary | ICD-10-CM

## 2021-07-10 MED ORDER — METHYLPREDNISOLONE 4 MG PO TBPK: ORAL_TABLET | Freq: Every day | ORAL | 1 refills | Status: DC

## 2021-07-10 NOTE — Progress Notes (Signed)
S: Pt in clinic for IUD removal.  Pt Mirena IUD was placed in 2018, pt was informed that she was able to keep IUD for 2 more years.  Pt is agreement of keeping IUD.   O: On exam patient appears to have psoriasis.  Varies size plaques over head,face, arms abdomen, back and legs. (See pictures in media).   A/P: 1. Plaque psoriasis  - methylPREDNISolone (MEDROL DOSEPAK) 4 MG TBPK tablet; Take by mouth daily. Take as directed  Dispense: 1 each; Refill: 1  Consulted K.Ernestina Patches, MD on generalized blotches of various sizes on patient body.  Patient reports that blotches appeared in July.  Patient reports itching.    Discussed with patient about psoriasis and need for PC.   [facility] and instructed with OTC treatment's that can be used to help relieve treatment.    Pt . Verbalized understanding.    Junious Dresser, FNP

## 2021-07-10 NOTE — Progress Notes (Signed)
Mirena IUD placed 06/30/2016.  Pt informed that the Mirena is good x 2 more years.  Pt has decided to keep her Mirena until 2025, however, she would like a Provider to look at a rash that covers her body

## 2021-07-25 NOTE — Progress Notes (Addendum)
I was consulted due the patient's rash. Findings are most consistent with psoriasis which on further question the patient has several family members with this diagnosis.  See images below. Recommended OTC treatments and PCP care given manifestation. Rx sent for medrol dose pack to see if this burst helps with systemic rash.

## 2021-10-02 ENCOUNTER — Telehealth: Payer: Self-pay

## 2021-10-02 NOTE — Telephone Encounter (Signed)
Telephone call to patient today to schedule her repeat PAP and PE due 07/2021.  Left message to return my call at [phone].  Language Line used.  Maggie Schwalbe / [phone] Hart Carwin, RN ? ?

## 2021-10-15 NOTE — Telephone Encounter (Signed)
Telephone call to patient this afternoon regarding her repeat PAP and PE due February 2023.  Voicemail full, but could send text per message on phone.  Text sent in Spanish staing the same thing the PAP letter says. Language Line used today.  Jesus / [phone] Hart Carwin, RN ? ?

## 2021-10-17 NOTE — Telephone Encounter (Signed)
Will close to PAP f/u due to no response to PAP letter sent, text and message left 10-02-2021.  Will await initiation of patient contacting [facility]. Hart Carwin, RN ? ?

## 2021-11-10 ENCOUNTER — Ambulatory Visit (LOCAL_COMMUNITY_HEALTH_CENTER): Payer: Self-pay | Admitting: Nurse Practitioner

## 2021-11-10 ENCOUNTER — Encounter: Payer: Self-pay | Admitting: Nurse Practitioner

## 2021-11-10 VITALS — BP 148/92 | HR 72 | Ht 64.0 in | Wt 167.0 lb

## 2021-11-10 DIAGNOSIS — F32A Depression, unspecified: Secondary | ICD-10-CM

## 2021-11-10 DIAGNOSIS — Z01419 Encounter for gynecological examination (general) (routine) without abnormal findings: Secondary | ICD-10-CM

## 2021-11-10 DIAGNOSIS — Z3009 Encounter for other general counseling and advice on contraception: Secondary | ICD-10-CM

## 2021-11-10 DIAGNOSIS — Z113 Encounter for screening for infections with a predominantly sexual mode of transmission: Secondary | ICD-10-CM

## 2021-11-10 LAB — WET PREP FOR TRICH, YEAST, CLUE
Trichomonas Exam: NEGATIVE
Yeast Exam: NEGATIVE

## 2021-11-10 LAB — HM HIV SCREENING LAB: HM HIV Screening: NEGATIVE

## 2021-11-10 NOTE — Progress Notes (Signed)
[facility] DEPARTMENT ?Family Planning Clinic ?[address] [employer] ?Main Number: [phone] ? ? ? ?Family Planning Visit- Initial Visit ? ?Subjective:  ?Stacy Bauer is a 41 y.o.  [phone]   being seen today for an initial annual visit and to discuss reproductive life planning.  The patient is currently using IUD or IUS for pregnancy prevention. Patient reports   does not want a pregnancy in the next year.   ? ? report they are looking for a method that provides High efficacy at preventing pregnancy ? ?Patient has the following medical conditions has Overweight BMI=27.8 and Abnormal Pap smear of cervix on their problem list. ? ?Chief Complaint  ?Patient presents with  ? Annual Exam  ? ? ?Patient reports to clinic today for a physical.   ? ?  ? ?Body mass index is 28.67 kg/m?. - Patient is eligible for diabetes screening based on BMI and age >73?  yes ?HA1C ordered? yes ? ?Patient reports 1  partner/s in last year. Desires STI screening?  Yes ? ?Has patient been screened once for HCV in the past?  No ? No results found for: HCVAB ? ?Does the patient have current drug use (including MJ), have a partner with drug use, and/or has been incarcerated since last result? No  ?If yes-- Screen for HCV through [facility] ?  ?Does the patient meet criteria for HBV testing? No ? ?Criteria:  ?-Household, sexual or needle sharing contact with HBV ?-History of drug use ?-HIV positive ?-Those with known Hep C ? ? ?Health Maintenance Due  ?Topic Date Due  ? COVID-19 Vaccine (1) Never done  ? Hepatitis C Screening  Never done  ? PAP SMEAR-Modifier  08/07/2021  ? ? ?Review of Systems  ?Constitutional:  Negative for chills, fever, malaise/fatigue and weight loss.  ?HENT:  Negative for congestion, hearing loss and sore throat.   ?Eyes:  Negative for blurred vision, double vision and photophobia.  ?     Yellowish eyes  ?Respiratory:  Negative for shortness of breath.   ?Cardiovascular:  Negative for chest  pain.  ?Gastrointestinal:  Positive for nausea. Negative for abdominal pain, blood in stool, constipation, diarrhea, heartburn and vomiting.  ?Genitourinary:  Negative for dysuria and frequency.  ?     Vaginal dishcarge  ?Musculoskeletal:  Negative for back pain, joint pain and neck pain.  ?Skin:  Positive for rash. Negative for itching.  ?     History of psoriasis   ?Neurological:  Negative for dizziness, weakness and headaches.  ?Endo/Heme/Allergies:  Does not bruise/bleed easily.  ?Psychiatric/Behavioral:  Negative for depression, substance abuse and suicidal ideas.   ? ?The following portions of the patient's history were reviewed and updated as appropriate: allergies, current medications, past family history, past medical history, past social history, past surgical history and problem list. Problem list updated. ? ? ?See flowsheet for other program required questions. ? ?Objective:  ? ?Vitals:  ? 11/10/21 1523 11/10/21 1529  ?BP: (!) 151/99 (!) 148/92  ?Pulse: 72   ?Weight: 167 lb (75.8 kg)   ?Height: 5\' 4"  (1.626 m)   ? ? ?Physical Exam ?Constitutional:   ?   Appearance: Normal appearance.  ?HENT:  ?   Head: Normocephalic.  ?   Right Ear: External ear normal.  ?   Left Ear: External ear normal.  ?   Nose: Nose normal.  ?   Mouth/Throat:  ?   Lips: Pink.  ?   Mouth: Mucous membranes are  moist.  ?   Comments: No visible signs of dental caries  ?Eyes:  ?   Pupils: Pupils are equal, round, and reactive to light.  ?Cardiovascular:  ?   Rate and Rhythm: Normal rate and regular rhythm.  ?Pulmonary:  ?   Effort: Pulmonary effort is normal.  ?   Breath sounds: Normal breath sounds.  ?Chest:  ?   Comments: Breasts:  ?      Right: Normal. No swelling, mass, nipple discharge, skin change or tenderness.  ?      Left: Normal. No swelling, mass, nipple discharge, skin change or tenderness.   ? ?  ?Abdominal:  ?   General: Abdomen is flat. Bowel sounds are normal.  ?   Palpations: Abdomen is soft.  ?Genitourinary: ?    Comments: External genitalia/pubic area without nits, lice, edema, erythema, lesions and inguinal adenopathy. ?Vagina with normal mucosa and discharge. ?Cervix without visible lesions. ?Uterus firm, mobile, nt, no masses, no CMT, no adnexal tenderness or fullness. pH 4.5. ?IUD strings visualized  ?Musculoskeletal:  ?   Cervical back: Full passive range of motion without pain, normal range of motion and neck supple.  ?Skin: ?   General: Skin is warm and dry.  ?   Comments: Psoriasis noted on extremities, back, chest, and abdomen.    ?Neurological:  ?   Mental Status: She is alert and oriented to person, place, and time.  ?Psychiatric:     ?   Attention and Perception: Attention normal.     ?   Mood and Affect: Mood normal.     ?   Speech: Speech normal.     ?   Behavior: Behavior is cooperative.  ? ? ? ? ?Assessment and Plan:  ?Stacy Bauer is a 41 y.o. female presenting to the [facility] for an initial annual wellness/contraceptive visit ? ?Contraception counseling: Reviewed options based on patient desire and reproductive life plan. Patient is interested in IUD or IUS. This was provided to the patient today.  ? ?Risks, benefits, and typical effectiveness rates were reviewed.  Questions were answered.  Written information was also given to the patient to review.   ? ?The patient will follow up in  1 years for surveillance.  The patient was told to call with any further questions, or with any concerns about this method of contraception.  Emphasized use of condoms 100% of the time for STI prevention. ? ?Need for ECP was assessed. ECP not offered due to continuous use of a birth control method.   ? ? ?1. Family planning counseling ?-41 year old female in clinic today for a physical.  Patient currently has a Mirena that was placed 07/07/16.  Patient informed Toney ReilMirena is now good for 7 years.  Patient ok with continuing with current method. ?-ROS reviewed.  Patient reports vaginal discharge,  agrees to STD screening today.  Patient states she feels like she has yellowing of the eyes.  Not visualized on physical exam.  Patient given PCP list for further evaluation.  Patient also reports nausea.   Informed patient to eat 6 small frequent meals, high in protein to assist with nausea.  Patient with history of psoriasis, currently under dermatology care.  Will continue with recommended regimen.   ?-Patient meets standards for HgbA1C.  Patient sent to lab for blood work.  ? ?- Hemoglobin A1C ? ?2. Well woman exam ?-Normal well woman exam. ?-CBE today, next due 10/2022. ?-PAP today ? ?- IGP, Aptima  HPV ? ?3. Screening examination for venereal disease ?-Patient agrees to STD screening. ?-Patient accepted all screenings including vaginal CT/GC and bloodwork for HIV/RPR.  ?Patient meets criteria for HepB screening? No. Ordered? No - low risk  ?Patient meets criteria for HepC screening? No. Ordered? No - low risk ? ?Treat wet prep per standing order ?Discussed time line for State Lab results and that patient will be called with positive results and encouraged patient to call if she had not heard in 2 weeks.  ?Counseled to return or seek care for continued or worsening symptoms ?Recommended condom use with all sex ? ?Patient is currently using *Mirena to prevent pregnancy.   ? ?- WET PREP FOR TRICH, YEAST, CLUE ?- HIV [place] [facility] ?- Syphilis Serology, [place] [facility] ?- Chlamydia/Gonorrhea [place] [facility] ? ? ?4. Depression, unspecified depression type ?-PHQ9 elevated.  Patient is sadden by the loss of her son.  Denies thoughts of self harm.  Agrees to counseling services through [facility].  Referral submitted. ? ?- Ambulatory referral to Behavioral Health  ? ? ?Return in about 1 year (around 11/11/2022) for Annual well-woman exam. ? ?Due to a language barrier an interpreter Roddie Mc Yemen) was used for the provider portion of the visit.    ? ?Glenna Fellows, FNP ? ?

## 2021-11-10 NOTE — Progress Notes (Signed)
States has medicine and shampoo given to her by dermatologist in [place]. Does not know name of medicine or practice. Jossie Ng, RN ?Wet prep negative. Jossie Ng, RN ? ?

## 2021-11-11 LAB — HEMOGLOBIN A1C
Est. average glucose Bld gHb Est-mCnc: 128 mg/dL
Hgb A1c MFr Bld: 6.1 % — ABNORMAL HIGH (ref 4.8–5.6)

## 2021-11-20 ENCOUNTER — Telehealth: Payer: Self-pay

## 2021-11-20 ENCOUNTER — Other Ambulatory Visit
Admission: RE | Admit: 2021-11-20 | Discharge: 2021-11-20 | Disposition: A | Discharge status: Home / Self Care | Payer: BC Managed Care – PPO | Attending: Dermatology | Admitting: Dermatology

## 2021-11-20 DIAGNOSIS — Z79899 Other long term (current) drug therapy: Secondary | ICD-10-CM | POA: Diagnosis not present

## 2021-11-20 DIAGNOSIS — Z5181 Encounter for therapeutic drug level monitoring: Secondary | ICD-10-CM | POA: Insufficient documentation

## 2021-11-20 LAB — IGP, APTIMA HPV
HPV Aptima: POSITIVE — AB
PAP Smear Comment: 0

## 2021-11-20 LAB — CBC WITH DIFFERENTIAL/PLATELET
Abs Immature Granulocytes: 0.02 10*3/uL (ref 0.00–0.07)
Basophils Absolute: 0 10*3/uL (ref 0.0–0.1)
Basophils Relative: 1 %
Eosinophils Absolute: 0.1 10*3/uL (ref 0.0–0.5)
Eosinophils Relative: 2 %
HCT: 36.8 % (ref 36.0–46.0)
Hemoglobin: 12.6 g/dL (ref 12.0–15.0)
Immature Granulocytes: 0 %
Lymphocytes Relative: 29 %
Lymphs Abs: 1.9 10*3/uL (ref 0.7–4.0)
MCH: 30.9 pg (ref 26.0–34.0)
MCHC: 34.2 g/dL (ref 30.0–36.0)
MCV: 90.2 fL (ref 80.0–100.0)
Monocytes Absolute: 0.5 10*3/uL (ref 0.1–1.0)
Monocytes Relative: 8 %
Neutro Abs: 3.8 10*3/uL (ref 1.7–7.7)
Neutrophils Relative %: 60 %
Platelets: 269 10*3/uL (ref 150–400)
RBC: 4.08 MIL/uL (ref 3.87–5.11)
RDW: 12.6 % (ref 11.5–15.5)
WBC: 6.4 10*3/uL (ref 4.0–10.5)
nRBC: 0 % (ref 0.0–0.2)

## 2021-11-20 LAB — COMPREHENSIVE METABOLIC PANEL
ALT: 25 U/L (ref 0–44)
AST: 23 U/L (ref 15–41)
Albumin: 4.7 g/dL (ref 3.5–5.0)
Alkaline Phosphatase: 69 U/L (ref 38–126)
Anion gap: 7 (ref 5–15)
BUN: 19 mg/dL (ref 6–20)
CO2: 24 mmol/L (ref 22–32)
Calcium: 9.2 mg/dL (ref 8.9–10.3)
Chloride: 103 mmol/L (ref 98–111)
Creatinine, Ser: 1.06 mg/dL — ABNORMAL HIGH (ref 0.44–1.00)
GFR, Estimated: >60 mL/min (ref >60)
Glucose, Bld: 105 mg/dL — ABNORMAL HIGH (ref 70–99)
Potassium: 3.4 mmol/L — ABNORMAL LOW (ref 3.5–5.1)
Sodium: 134 mmol/L — ABNORMAL LOW (ref 135–145)
Total Bilirubin: 0.7 mg/dL (ref 0.3–1.2)
Total Protein: 7.9 g/dL (ref 6.5–8.1)

## 2021-11-20 LAB — HEPATITIS PANEL, ACUTE
HCV Ab: NONREACTIVE
Hep A IgM: NONREACTIVE
Hep B C IgM: NONREACTIVE
Hepatitis B Surface Ag: NONREACTIVE

## 2021-11-20 LAB — VITAMIN D 25 HYDROXY (VIT D DEFICIENCY, FRACTURES): Vit D, 25-Hydroxy: 13.71 ng/mL — ABNORMAL LOW (ref 30–100)

## 2021-11-20 NOTE — Telephone Encounter (Signed)
Telephone call to patient today regarding her PAP results (LSIL and + HPV) and the need for a Colpo referral.  Patient voicemail is full.  Language Line used today.  Valarie Merino / [phone].  Dahlia Bailiff, RN

## 2021-11-25 NOTE — Telephone Encounter (Signed)
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  Patient voicemail is not setup.  Abnormal PAP f/u letter (unable to contact) mailed today.  Language Line used today.  Verdis Frederickson / [phone].  Dahlia Bailiff, RN

## 2021-11-25 NOTE — Telephone Encounter (Signed)
Return call by patient.  Left a voicemail to return her call. Hart Carwin, RN  Return call back to patient regarding her abnormal PAP results and the need for a Colpo.  Left message to return my call at [phone].  Language Line used today.  Gillis Santa / [phone].  Hart Carwin, RN

## 2021-11-27 LAB — QUANTIFERON-TB GOLD PLUS

## 2021-11-27 NOTE — Telephone Encounter (Signed)
Return call by patient, leaving 2 voicemail's while I was on the phone with another patient. Dahlia Bailiff, RN   Return call to patient this afternoon.  Left message to return my call at [phone].  Rowland Lathe interpreted the call today.  Dahlia Bailiff, RN

## 2021-11-27 NOTE — Telephone Encounter (Signed)
Return call by patient.  Spoke to her regarding her 11-10-2021 PAP results (LSIL and + HPV).  She reports having [employer] through her employer.  She desires her Colpo to [facility].  Referral completed today with confirmation.  Salli Real interpreted the call today.  Hart Carwin, RN

## 2021-12-05 ENCOUNTER — Other Ambulatory Visit
Admission: RE | Admit: 2021-12-05 | Discharge: 2021-12-05 | Disposition: A | Discharge status: Home / Self Care | Payer: BC Managed Care – PPO | Attending: Dermatology | Admitting: Dermatology

## 2021-12-05 DIAGNOSIS — R87622 Low grade squamous intraepithelial lesion on cytologic smear of vagina (LGSIL): Secondary | ICD-10-CM | POA: Diagnosis not present

## 2021-12-05 DIAGNOSIS — R87820 Cervical low risk human papillomavirus (HPV) DNA test positive: Secondary | ICD-10-CM | POA: Insufficient documentation

## 2021-12-09 LAB — QUANTIFERON-TB GOLD PLUS (RQFGPL)
QuantiFERON Mitogen Value: >10 IU/mL
QuantiFERON Nil Value: 0.1 IU/mL
QuantiFERON TB1 Ag Value: 0.09 IU/mL
QuantiFERON TB2 Ag Value: 0.12 IU/mL

## 2021-12-09 LAB — QUANTIFERON-TB GOLD PLUS: QuantiFERON-TB Gold Plus: NEGATIVE

## 2022-01-14 ENCOUNTER — Other Ambulatory Visit (HOSPITAL_COMMUNITY)
Admission: RE | Admit: 2022-01-14 | Discharge: 2022-01-14 | Disposition: A | Discharge status: Home / Self Care | Payer: BC Managed Care – PPO | Source: Ambulatory Visit | Attending: Obstetrics and Gynecology | Admitting: Obstetrics and Gynecology

## 2022-01-14 ENCOUNTER — Encounter: Payer: Self-pay | Admitting: Obstetrics and Gynecology

## 2022-01-14 ENCOUNTER — Ambulatory Visit (INDEPENDENT_AMBULATORY_CARE_PROVIDER_SITE_OTHER): Payer: BC Managed Care – PPO | Admitting: Obstetrics and Gynecology

## 2022-01-14 VITALS — BP 145/92 | HR 67 | Ht 65.0 in | Wt 167.4 lb

## 2022-01-14 DIAGNOSIS — B977 Papillomavirus as the cause of diseases classified elsewhere: Secondary | ICD-10-CM | POA: Insufficient documentation

## 2022-01-14 DIAGNOSIS — R87622 Low grade squamous intraepithelial lesion on cytologic smear of vagina (LGSIL): Secondary | ICD-10-CM | POA: Diagnosis present

## 2022-01-14 NOTE — Progress Notes (Signed)
     GYNECOLOGY OFFICE COLPOSCOPY PROCEDURE NOTE  41 y.o. [id number] here for colposcopy for low-grade squamous intraepithelial neoplasia (LGSIL - encompassing HPV,mild dysplasia,CIN I) with HR HPV+ pap smear on 11/10/2021. She was referred from [facility]. Discussed role for HPV in cervical dysplasia, need for surveillance. Uses IUD for contraception.  Spanish Interpreter present for today's visit.   Patient gave informed written consent, time out was performed.  Placed in lithotomy position. Cervix viewed with speculum and colposcope after application of acetic acid.   Colposcopy adequate? Yes  Findings: IUD threads visible, 3 cm in cervical os. No mosaicism, no punctation, no abnormal vasculature, and acetowhite lesion(s) noted at 7 o'clock; corresponding biopsies obtained.  ECC specimen obtained. All specimens were labeled and sent to pathology.  Chaperone was present during entire procedure.  Patient was given post procedure instructions.  Will follow up pathology and manage accordingly; patient will be contacted with results and recommendations.  Routine preventative health maintenance measures emphasized.    Hildred Laser, MD [facility]

## 2022-01-14 NOTE — Patient Instructions (Signed)
Colposcopa, cuidados posteriores Colposcopy, Care After  La siguiente informacin ofrece orientacin sobre cmo cuidarse despus del procedimiento. El mdico tambin podr darle instrucciones ms especficas. Comunquese con el mdico si tiene problemas o preguntas. Qu puedo esperar despus del procedimiento? Si le realizaron una colposcopa sin biopsia, puede esperar sentirse bien inmediatamente despus del procedimiento. Sin embargo, es posible que tenga algo de manchado de sangre durante algunos das. Puede retomar sus actividades normales. Si se le realiz una colposcopa con biopsia, despus del procedimiento es frecuente que presente lo siguiente: Sensibilidad y dolor leve. Esto puede durar algunos das. Sangrado leve de la vagina o secrecin de color oscuro y granulada. Esto puede durar algunos das. La secrecin puede deberse a un lquido (solucin) que se us durante el procedimiento. Durante este tiempo deber usar un apsito sanitario. Manchas de sangre durante al menos 48 horas despus del procedimiento. Siga estas instrucciones en su casa: Medicamentos Use los medicamentos de venta libre y los recetados solamente como se lo haya indicado el mdico. Hable con el mdico acerca de qu tipo de analgsico de venta libre y recetado puede volver a tomar. Es especialmente importante que hable con el mdico si toma anticoagulantes. Actividad Evite usar productos de ducha vaginal, usar tampones y tener sexo durante al menos 3 das despus del procedimiento o durante el tiempo que le haya indicado el mdico. Retome sus actividades normales como se lo haya indicado el mdico. Pregntele al mdico qu actividades son seguras para usted. Instrucciones generales Pregunte al mdico si puede tomar baos de inmersin, nadar o usar el jacuzzi. Puede ducharse. Si usa un mtodo anticonceptivo (anticoncepcin), contine utilizndolo. Concurra a todas las visitas de seguimiento. Esto es  importante. Comunquese con un mdico si: Tiene fiebre o escalofros. Se desmaya o tiene sensacin de desvanecimiento. Solicite ayuda de inmediato si: Tiene una hemorragia abundante por la vagina o despide cogulos de sangre. Sangrado abundante es el sangrado que empapa una toalla higinica en menos de 1 hora. Tiene secrecin vaginal que es anormal, tiene color amarillo o tiene mal olor. Puede ser un signo de infeccin. Tiene dolor intenso o clicos en la parte baja del abdomen que no se van con medicamentos. Resumen Si se le realiz una colposcopa sin biopsia, puede esperar sentirse bien de inmediato, pero es posible que presente manchas de sangre por algunos das. Puede retomar sus actividades normales. Si se le realiz una colposcopa con biopsia, es comn tener un dolor leve durante algunos das y manchas de sangre durante 48 horas despus del procedimiento. Evite usar productos de ducha vaginal, usar tampones y tener sexo durante al menos 3 das despus del procedimiento o durante el tiempo que le haya indicado el mdico. Busque ayuda de inmediato si tiene sangrado profuso, dolor intenso o signos de infeccin. Esta informacin no tiene como fin reemplazar el consejo del mdico. Asegrese de hacerle al mdico cualquier pregunta que tenga. Document Revised: 12/03/2020 Document Reviewed: 12/03/2020 Elsevier Patient Education  2023 Elsevier Inc.  

## 2022-01-16 LAB — SURGICAL PATHOLOGY

## 2022-01-30 ENCOUNTER — Telehealth: Payer: Self-pay

## 2022-01-30 NOTE — Telephone Encounter (Signed)
Telephone call to [facility] regarding Colpo f/u per Dr. Valentino Saxon.  Colpo was 01-14-2022.  Surgical path report is final.  Spoke with Tresa Moore and she will have Crystal, Dr. Oretha Milch nurse inquire and send Glenna Fellows, NP the plan of care via message thru Epic.  Hart Carwin, RN

## 2022-02-11 NOTE — Telephone Encounter (Signed)
Per staff message today by Dr. Valentino Saxon:  Repeat PAP in 1 year (12-2022).  Message sent to Glenna Fellows, NP.  Hart Carwin, RN

## 2022-02-11 NOTE — Progress Notes (Signed)
Patient had abnormal PAP 11-10-21 LSIL and + HPV.  Colpo by Dr. Valentino Saxon 01-14-22. Copied and pasted staff message by Dr. Valentino Saxon 02-11-2022:   RE: Colpo f/u 01-14-22 Received: Today Hildred Laser, MD  Tommie Raymond, CMA; Hart Carwin, RN I apologize, not sure how this was missed.  Her biopsy results were CIN I, she will need a follow up pap smear in 1 year.   Dr. Valentino Saxon     PAP follow-up due in 1 year (12-2022) per Dr. Valentino Saxon.  Note routed to Glenna Fellows, NP for review. Hart Carwin, RN

## 2022-02-24 ENCOUNTER — Telehealth: Payer: Self-pay

## 2022-02-24 NOTE — Telephone Encounter (Signed)
-----   Message from Stacy Bauer, [place] sent at 02/11/2022  1:36 PM EDT ----- Regarding: FW: Colpo f/u 01-14-22 Can you call patient for me.  CB ----- Message ----- From: Hart Carwin, RN Sent: 02/11/2022  12:56 PM EDT To: Hildred Laser, MD; Stacy Bauer, CMA Subject: RE: Colpo f/u 01-14-22                          Dr. Valentino Saxon, No worries.  Thank you so much for your prompt response.  I will let Glenna Fellows, NP know.  Thanks,  Kriste Basque, RN ----- Message ----- From: Hildred Laser, MD Sent: 02/11/2022  12:35 PM EDT To: Hart Carwin, RN; Stacy Bauer, CMA Subject: RE: Colpo f/u 01-14-22                          I apologize, not sure how this was missed.  Her biopsy results were CIN I, she will need a follow up pap smear in 1 year.   Dr. Valentino Saxon ----- Message ----- From: Stacy Bauer, CMA Sent: 02/11/2022  11:13 AM EDT To: Hildred Laser, MD Subject: Annell Greening: Colpo f/u 01-14-22                           ----- Message ----- From: Hart Carwin, RN Sent: 02/11/2022  10:51 AM EDT To: Stacy Bauer, CMA Subject: Colpo f/u 01-14-22                              Good Morning Crystal,  I just wanted to touch base regarding my patient f/u after Colpo with Dr. Valentino Saxon on 01-14-22.  I noticed the path report is back, but no f/u?   Our provider is curious what the f/u plan for her would be?  Could you please let me know the next steps for my patient if you don't mind.      Thank you and hope you are having a wonderful day!  Becky

## 2023-04-28 ENCOUNTER — Encounter: Payer: Self-pay | Admitting: Advanced Practice Midwife

## 2023-04-28 ENCOUNTER — Ambulatory Visit (LOCAL_COMMUNITY_HEALTH_CENTER): Payer: Self-pay | Admitting: Advanced Practice Midwife

## 2023-04-28 ENCOUNTER — Ambulatory Visit: Payer: Self-pay

## 2023-04-28 VITALS — BP 132/85 | HR 67 | Ht 65.0 in | Wt 164.6 lb

## 2023-04-28 DIAGNOSIS — R87612 Low grade squamous intraepithelial lesion on cytologic smear of cervix (LGSIL): Secondary | ICD-10-CM

## 2023-04-28 DIAGNOSIS — F329 Major depressive disorder, single episode, unspecified: Secondary | ICD-10-CM

## 2023-04-28 DIAGNOSIS — Z3009 Encounter for other general counseling and advice on contraception: Secondary | ICD-10-CM

## 2023-04-28 DIAGNOSIS — Z3049 Encounter for surveillance of other contraceptives: Secondary | ICD-10-CM

## 2023-04-28 DIAGNOSIS — F32A Depression, unspecified: Secondary | ICD-10-CM

## 2023-04-28 HISTORY — DX: Depression, unspecified: F32.A

## 2023-04-28 LAB — HM HIV SCREENING LAB: HM HIV Screening: NEGATIVE

## 2023-04-28 LAB — HEMOGLOBIN, FINGERSTICK: Hemoglobin: 12.9 g/dL (ref 11.1–15.9)

## 2023-04-28 LAB — WET PREP FOR TRICH, YEAST, CLUE: Trichomonas Exam: NEGATIVE

## 2023-04-28 LAB — HM HEPATITIS C SCREENING LAB: HM Hepatitis Screen: NEGATIVE

## 2023-04-28 NOTE — Progress Notes (Signed)
[facility] [address] Main Number: [phone]   Family Planning Visit- Initial Visit  Subjective:  Stacy Bauer is a 42 y.o. SHF nonsmoker [id number]  (25, 21, 14, 9/6)  being seen today for an initial annual visit and to discuss reproductive life planning.  The patient is currently using IUD or IUS for pregnancy prevention. Patient reports   does not want a pregnancy in the next year.     report they are looking for a method that provides High efficacy at preventing pregnancy  Patient has the following medical conditions has Overweight BMI=27.8 and Abnormal Pap smear of cervix on their problem list.  Chief Complaint  Patient presents with   Annual Exam    PE/IUD REMOVAL ???   Patient reports here for physical. LMP 04/08/23. Last sex 04/04/23 without condom; with current partner x 6 mo; 1 partner in last 3 mo. Mirena inserted 07/07/16 and happy with it. Last PE 11/10/21. Pt visibly upset and crying over death of her 58 yo son 2 years ago. Not working. Living with her 3 kids. Last dental exam 5 years ago. Last ETOH 05/2022 (6 beers). +cry daily, poor sleep to wnl sleep, appetite wnl, energy level wnl, -moody, irritable, -anhedonia, -SI/HI. Last pap LGSIL HPV+ 11/10/21. Colpo 01/15/23 CIN I.   Patient denies cigs, vaping, cigars, MJ  Body mass index is 27.39 kg/m. - Patient is eligible for diabetes screening based on BMI> 25 and age >35?  yes HA1C ordered? yes  Patient reports 1  partner/s in last year. Desires STI screening?  Yes  Has patient been screened once for HCV in the past?  No   Lab Results  Component Value Date   HCVAB NON REACTIVE 11/20/2021    Does the patient have current drug use (including MJ), have a partner with drug use, and/or has been incarcerated since last result? No  If yes-- Screen for HCV through [facility]   Does the patient meet criteria for HBV testing? Yes  Criteria:  -Household,  sexual or needle sharing contact with HBV -History of drug use -HIV positive -Those with known Hep C   Health Maintenance Due  Topic Date Due   Cervical Cancer Screening (Pap smear)  11/11/2022   INFLUENZA VACCINE  Never done   COVID-19 Vaccine (1 - 2023-24 season) Never done    Review of Systems  All other systems reviewed and are negative.   The following portions of the patient's history were reviewed and updated as appropriate: allergies, current medications, past family history, past medical history, past social history, past surgical history and problem list. Problem list updated.   See flowsheet for other program required questions.  Objective:   Vitals:   04/28/23 1023  BP: 132/85  Pulse: 67  Weight: 164 lb 9.6 oz (74.7 kg)  Height: 5\' 5"  (1.651 m)    Physical Exam Constitutional:      Appearance: Normal appearance. She is normal weight.  HENT:     Head: Normocephalic and atraumatic.     Mouth/Throat:     Mouth: Mucous membranes are moist.     Comments: Last dental exam 5 years ago Eyes:     Conjunctiva/sclera: Conjunctivae normal.  Neck:     Thyroid: No thyroid mass, thyromegaly or thyroid tenderness.  Cardiovascular:     Rate and Rhythm: Normal rate and regular rhythm.  Pulmonary:     Effort: Pulmonary effort is normal.  Breath sounds: Normal breath sounds.  Chest:  Breasts:    Right: Normal.     Left: Normal.  Abdominal:     Palpations: Abdomen is soft.     Comments: Soft without masses or tenderness  Genitourinary:    General: Normal vulva.     Exam position: Lithotomy position.     Pubic Area: No pubic lice.      Vagina: Vaginal discharge (white creamy leukorrhea, ph<4.5) present.     Cervix: Friability (to pap) present.     Uterus: Normal.      Adnexa: Right adnexa normal and left adnexa normal.     Rectum: Normal.     Comments: No evidence of nits Friable to pap IUD strings visualized Musculoskeletal:        General: Normal range  of motion.     Cervical back: Normal range of motion and neck supple.  Skin:    General: Skin is warm and dry.  Neurological:     Mental Status: She is alert.  Psychiatric:        Mood and Affect: Mood normal.      Assessment and Plan:  Stacy Bauer is a 42 y.o. female presenting to the [facility] Department for an initial annual wellness/contraceptive visit  Contraception counseling: Reviewed options based on patient desire and reproductive life plan. Patient is interested in IUD or IUS. This was provided to the patient today.  if not why not clearly documented  Risks, benefits, and typical effectiveness rates were reviewed.  Questions were answered.  Written information was also given to the patient to review.    The patient will follow up in  1 years for surveillance.  The patient was told to call with any further questions, or with any concerns about this method of contraception.  Emphasized use of condoms 100% of the time for STI prevention.  Educated on ECP and assessed for need of ECP. Patient reported not meeting criteria.  Reviewed options and patient desired No method of ECP, declined all    1. 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 Encounter for surveillance of other contraceptive Mirena inserted 07/07/16  3. Low grade squamous intraepithelial lesion on cytologic smear of cervix (LGSIL) Pap done today   No follow-ups on file.  No future appointments.  Alberteen Spindle, CNM

## 2023-04-28 NOTE — Progress Notes (Signed)
Pt is here for PE, Pap smear and STD testing.  Wet mount results reviewed, no treatment required per Provider.  Pt notified of normal Hgb results.  FP packet given.  Berdie Ogren, RN

## 2023-04-29 LAB — HGB A1C W/O EAG: Hgb A1c MFr Bld: 6.6 % — ABNORMAL HIGH (ref 4.8–5.6)

## 2023-04-30 ENCOUNTER — Telehealth: Payer: Self-pay

## 2023-04-30 NOTE — Telephone Encounter (Signed)
Pt wants a call back from carissa bell, needs interpreter

## 2023-04-30 NOTE — Telephone Encounter (Signed)
Pt notified of Hgb A1C result of 6.6.  Instructed pt to follow-up with PCP.  Pt verbalizes understanding.  Weston Anna #[phone] with [employer] assisted with the call.  Berdie Ogren, RN

## 2023-04-30 NOTE — Telephone Encounter (Signed)
LM for pt to return my call.  Stacy Bauer #[id number] with [employer] assisted with the call.  Berdie Ogren, RN

## 2023-05-05 LAB — IGP, APTIMA HPV
HPV Aptima: NEGATIVE
PAP Smear Comment: 0

## 2024-03-22 ENCOUNTER — Ambulatory Visit: Payer: Self-pay | Admitting: Family Medicine

## 2024-03-22 DIAGNOSIS — Z113 Encounter for screening for infections with a predominantly sexual mode of transmission: Secondary | ICD-10-CM

## 2024-03-22 LAB — WET PREP FOR TRICH, YEAST, CLUE
Clue Cell Exam: POSITIVE — AB
Trichomonas Exam: NEGATIVE
Yeast Exam: NEGATIVE

## 2024-03-22 LAB — HM HIV SCREENING LAB: HM HIV Screening: NEGATIVE

## 2024-03-22 NOTE — Patient Instructions (Addendum)
 STI screening - Today we obtained a vaginal swab to screen for gonorrhea, chlamydia, and trichomonas - We also obtained a blood sample to screen for HIV and syphilis - If the results are abnormal, I will give you a call.   Prueba de ITS - Hoy nos hicieron ignacia hisopado vaginal para Paramedic, clamidia y tricomonas - Tambin nos hicieron una muestra de sangre para detectar VIH and sfilis - Si los resultados son anormales, Engineer, structural.  Estimated time frame for results collected at the [facility] Department: Tiempo estimado para obtener los resultados en el Departamento de Salud del [place]: Same day El mismo da  Trichomonas Yeast / Vaguda BV (bacterial vaginosis) / VB (vaginosis bacteriana)   Within 1-2 weeks En 1-2 semanas  Gonorrhea / Gonorrea Chlamydia / Clamidia  Within 1-2 weeks En 1-2 semanas HIV / VIH Syphilis / Sfilis Hepatitis B Hepatitis C      Therapy Terapia You can use the following website to start looking for a therapist. Remember, finding a therapist you click with it a lot like speed dating - you have to sort through a bunch of lemons before finding what you like! Puedes usar el siguiente sitio web para Corporate investment banker a Public affairs consultant. Recuerda que Clinical research associate un terapeuta es muy sencillo, como en las citas rpidas: tienes que buscar entre un montn de opciones antes de Clinical research associate lo que te gusta!  Alan Hail, LCSW [facility] [place] en el [place] de Salud del [place]: [phone] [address], [place], [place] [address]  www.[facility].com Access to care line (24/7) [phone] Treatment information  Crisis assistance  Mobile crisis team connection Mental health needs  Substance use disorder  Intellectual and developmental disabilities www.[facility].com Lnea de acceso a atencin (24/7) [phone] Informacin sobre tratamientos  Asistencia en crisis   [employer] mvil con el equipo de crisis Necesidades de salud mental  Trastorno por consumo de sustancias  Discapacidades intelectuales y del desarrollo  [facility] Grief Counseling For children and teens who have experienced the death of a loved one Phone: [phone] [address] [place] [address] [email or web address] [facility] - Consejera de Duelo Para nios y adolescentes que han sufrido la prdida de un ser querido [place]elfono: [phone] [address] [place], [place] [address] [email or web address]  https://www.psychologytoday.com/us  Search for [place], [place] (or city of your choice).  On the next page, you will see filter options at the top.  You may filter therapists by insurance they take, issues you would like to work on, or therapy types.  https://www.psychologytoday.com/us  [place], [place] (o la ciudad que prefiera). En la pgina siguiente, ver opciones de filtro en la parte superior. Puede filtrar a los terapeutas por el seguro que aceptan, los problemas que le gustara abordar o el tipo de [place].  https://[email or web address]/ [employer] de salud mental para adolescentes en [place] del [employer] se ha asociado con el Departamento de Salud y Servicios Humanos de [place] del [place] para brindar servicios gratuitos de salud mental a cualquier adolescente del [place].

## 2024-03-22 NOTE — Progress Notes (Signed)
 STD visit. Wet Prep results reviewed with pt and requires no treatment. Condoms given. Wilkie Drought, RN.

## 2024-03-22 NOTE — Progress Notes (Signed)
 [facility] Department STI clinic [address] [place] [address] Main phone: [phone]  STI screening visit  Subjective:  Unknown Stacy Bauer is a 43 y.o. female being seen today for an STI screening visit. The patient reports they do not have symptoms.  Patient reports that they do not desire a pregnancy in the next year.   They reported they are not interested in discussing contraception today - IUD in place.   Patient's last menstrual period was 02/28/2024.  Patient has the following medical conditions:  Patient Active Problem List   Diagnosis Date Noted   Depression 04/28/2023   Abnormal Pap smear of cervix 08/14/2020   Overweight BMI=27.8 08/07/2020   Chief Complaint  Patient presents with   SEXUALLY TRANSMITTED DISEASE    Pt is here for STD screening and has no symptoms    HPI Patient reports she would like asymptomatic STI screening. IUD in place for contraception.   Does the patient using douching products? No  See flowsheet for further details and programmatic requirements Hyperlink available at the top of the signed note in blue.  Flow sheet content below:  Pregnancy Intention Screening Does the patient want to become pregnant in the next year?: No Does the patient's partner want to become pregnant in the next year?: No Would the patient like to discuss contraceptive options today?: No All Patients Anyone smoke around pt and/or pt's children?: No Anyone smoke inside pt's house?: No Anyone smoke inside car?: No Anyone smoke inside the workplace?: No Reason For STD Screen STD Screening: Is asymptomatic Have you ever had an STD?: No History of Antibiotic use in the past 2 weeks?: No STD Symptoms Denies all: Yes Risk Factors for Hep B Household, sexual, or needle sharing contact of a person infected with Hep B: No Sexual contact with a person who uses drugs not as prescribed?: No Currently or Ever used drugs not as  prescribed: No HIV Positive: No Men who have sex with men: N/A Have Hepatitis C: No History of Incarceration: No History of Homeslessness?: No Anal sex following anal drug use?: N/A Risk Factors for Hep C Currently using drugs not as prescribed: No Sexual partner(s) currently using drugs as not prescribed: No History of drug use: No HIV Positive: No People with a history of incarceration: No People born between the years of 20 and 75: No Abuse History Does patient feel they have a problem with Anxiety?: Yes Does patient feel they have a problem with Depression?: Yes Referral to Behavioral Health: No   Screening for MPX risk: Does the patient have an unexplained rash? No Is the patient MSM? No Does the patient endorse multiple sex partners or anonymous sex partners? No Did the patient have close or sexual contact with a person diagnosed with MPX? No Has the patient traveled outside the [place]  where MPX is endemic? No Is there a high clinical suspicion for MPX-- evidenced by one of the following No  -Unlikely to be chickenpox  -Lymphadenopathy  -Rash that present in same phase of evolution on any given body part  Screenings: Last HIV test per patient/review of record was  Lab Results  Component Value Date   HMHIVSCREEN Negative - Validated 04/28/2023    Lab Results  Component Value Date   HIV Non-reactive 03/09/2016     Last HEPC test per patient/review of record was  Lab Results  Component Value Date   HMHEPCSCREEN Negative-Validated 04/28/2023   No components found for: HEPC  Last HEPB test per patient/review of record was No components found for: HMHEPBSCREEN   Patient reports last pap was:   Lab Results  Component Value Date   SPECADGYN Comment 04/28/2023   Result Date Procedure Results Follow-ups  04/28/2023 IGP, Aptima HPV DIAGNOSIS:: Comment (A) Specimen adequacy:: Comment Clinician Provided ICD10: Comment Performed by:: Comment Electronically  signed by:: Comment PAP Smear Comment: . PATHOLOGIST PROVIDED ICD10:: Comment Note:: Comment Test Methodology: Comment HPV Aptima: Negative   01/14/2022 Surgical pathology SURGICAL PATHOLOGY: SURGICAL PATHOLOGY CASE: [id number] [name] Surgical Pathology Report     Clinical History: LGSIL, +HPV (cm)     FINAL MICROSCOPIC DIAGNOSIS:  A. ENDOCERVIX, CURETTAGE: Mucoinflammatory and squamous debris with a...   11/10/2021 IGP, Aptima HPV DIAGNOSIS:: Comment (A) Recommendation:: Comment (A) Specimen adequacy:: Comment Clinician Provided ICD10: Comment Performed by:: Comment Electronically signed by:: Comment PAP Smear Comment: . PATHOLOGIST PROVIDED ICD10:: Comment Note:: Comment Test Methodology: Comment HPV Aptima: Positive (A)   08/07/2020 IGP, Aptima HPV DIAGNOSIS:: Comment (A) Recommendation:: Comment (A) Specimen adequacy:: Comment Clinician Provided ICD10: Comment Performed by:: Comment Electronically signed by:: Comment PAP Smear Comment: . PATHOLOGIST PROVIDED ICD10:: Comment Note:: Comment Test Methodology: Comment HPV Aptima: Negative    Immunization history:  Immunization History  Administered Date(s) Administered   Tdap 03/09/2016    The following portions of the patient's history were reviewed and updated as appropriate: allergies, current medications, past medical history, past social history, past surgical history and problem list.  Objective:  There were no vitals filed for this visit.  Physical Exam Vitals and nursing note reviewed.  Constitutional:      Appearance: Normal appearance.  HENT:     Head: Normocephalic.     Mouth/Throat:     Mouth: Mucous membranes are moist.  Cardiovascular:     Rate and Rhythm: Normal rate.  Pulmonary:     Effort: Pulmonary effort is normal.  Genitourinary:    Comments: Declined genital exam-self swabbed Musculoskeletal:        General: Normal range of motion.  Lymphadenopathy:      Head:     Right side of head: No submandibular, preauricular or posterior auricular adenopathy.     Left side of head: No submandibular, preauricular or posterior auricular adenopathy.     Cervical: No cervical adenopathy.     Upper Body:     Right upper body: No supraclavicular adenopathy.     Left upper body: No supraclavicular adenopathy.  Skin:    General: Skin is warm and dry.  Neurological:     Mental Status: She is alert and oriented to person, place, and time.  Psychiatric:        Mood and Affect: Mood normal.    Assessment and Plan:  Danine Hor is a 43 y.o. female presenting to the [facility] Department for STI screening  1. Screening examination for venereal disease (Primary) - Chlamydia/Gonorrhea [place] [facility] - HIV [place] LAB - Syphilis Serology,  Lab - WET PREP FOR TRICH, YEAST, CLUE   Patient accepted the following screenings: vaginal CT/GC swab, vaginal wet prep, HIV, and RPR Patient meets criteria for HepB screening? No. Ordered? no Patient meets criteria for HepC screening? No. Ordered? no  Treat wet prep per standing order Discussed time line for State Lab results and that patient will be called with positive results and encouraged patient to call if she had not heard in 2 weeks.  Counseled to return or seek care for  continued or worsening symptoms Recommended repeat testing in 3 months with positive results. Recommended condom use with all sex for STI prevention.   Patient is currently using IUD to prevent pregnancy.    No follow-ups on file.  No future appointments.  Betsey CHRISTELLA Helling, MD

## 2024-04-07 ENCOUNTER — Telehealth: Payer: Self-pay | Admitting: Family Medicine

## 2024-04-28 ENCOUNTER — Ambulatory Visit: Payer: Self-pay

## 2024-04-28 VITALS — BP 159/97

## 2024-04-28 DIAGNOSIS — Z1231 Encounter for screening mammogram for malignant neoplasm of breast: Secondary | ICD-10-CM

## 2024-04-28 DIAGNOSIS — Z9189 Other specified personal risk factors, not elsewhere classified: Secondary | ICD-10-CM

## 2024-04-28 DIAGNOSIS — R87612 Low grade squamous intraepithelial lesion on cytologic smear of cervix (LGSIL): Secondary | ICD-10-CM

## 2024-04-28 DIAGNOSIS — Z124 Encounter for screening for malignant neoplasm of cervix: Secondary | ICD-10-CM

## 2024-04-28 DIAGNOSIS — R030 Elevated blood-pressure reading, without diagnosis of hypertension: Secondary | ICD-10-CM | POA: Insufficient documentation

## 2024-04-28 DIAGNOSIS — Z3009 Encounter for other general counseling and advice on contraception: Secondary | ICD-10-CM

## 2024-04-28 DIAGNOSIS — L409 Psoriasis, unspecified: Secondary | ICD-10-CM

## 2024-04-28 NOTE — Progress Notes (Signed)
 [facility] [address] [place] [address] Main phone: [phone]  Family Planning Visit - Repeat Yearly Visit  Subjective:  Stacy Bauer is a 43 y.o. [id number]  being seen today for an annual wellness visit and to discuss contraception options. The patient is currently using IUD (or IUS) for pregnancy prevention. Patient does not want a pregnancy in the next year.   Patient reports IUD (Mirena) was placed 07/07/2016.  Patient has the following medical problems:  Patient Active Problem List   Diagnosis Date Noted   Psoriasis 04/28/2024   Elevated blood pressure reading 04/28/2024   Depression 04/28/2023   Abnormal Pap smear of cervix 08/14/2020   Overweight BMI=27.8 08/07/2020   HPI Patient reports she had a Mirena IUD placed in January, 2018. When she learned that she has until January, 2026 before her IUD expires, she would prefer to leave it in until then. Let her know we could replace today if she desires.  She reports partner has had repeated episodes of a few (around 3) blister like bumps on his genitals that contain clear fluid and erupt, leave scars. Discussed it may be genital herpes, but he needs evaluation when symptomatic so that the lesions can be swabbed. She has been sexually active with him x4 months, she has NOT had any genital symptoms. Recent STI testing in September, declines STI screening today.  Needs pap test today: 10/2021: LSIL w/ positive HPV 01/14/2022: [place] CIN1 04/28/2023: ASCUS and HPV negative 04/28/2024: repeat today  BP today 159/97, repeat 162/96. She has never been diagnosed with hypertension per patient. Denies shortness of breath, chest pain, visual changes. No current PCP. She does endorse some headaches this past week that resolve with OTC treatment.  Has psoriasis, sees dermatology but difficulty affording the visits/treatments.  Patient denies breast or vaginal  concerns. Never had a mammogram.  Review of Systems  Neurological:  Positive for headaches.  All other systems reviewed and are negative.  See flowsheet for further details and programmatic requirements Hyperlink available at the top of the signed note in blue.  Flow sheet content below:    STI screening Patient reports 2 of partners in last year.  Does this patient desire STI screening?  No - declines, done in September 2025  Hepatitis C screening   Lab Results  Component Value Date   HCVAB NON REACTIVE 11/20/2021   Cervical Cancer Screening  Result Date Procedure Results Follow-ups  04/28/2023 IGP, Aptima HPV DIAGNOSIS:: Comment (A) Specimen adequacy:: Comment Clinician Provided ICD10: Comment Performed by:: Comment Electronically signed by:: Comment PAP Smear Comment: . PATHOLOGIST PROVIDED ICD10:: Comment Note:: Comment Test Methodology: Comment HPV Aptima: Negative   01/14/2022 Surgical pathology SURGICAL PATHOLOGY: SURGICAL PATHOLOGY CASE: [id number] [name] Surgical Pathology Report     Clinical History: LGSIL, +HPV (cm)     FINAL MICROSCOPIC DIAGNOSIS:  A. ENDOCERVIX, CURETTAGE: Mucoinflammatory and squamous debris with a...   11/10/2021 IGP, Aptima HPV DIAGNOSIS:: Comment (A) Recommendation:: Comment (A) Specimen adequacy:: Comment Clinician Provided ICD10: Comment Performed by:: Comment Electronically signed by:: Comment PAP Smear Comment: . PATHOLOGIST PROVIDED ICD10:: Comment Note:: Comment Test Methodology: Comment HPV Aptima: Positive (A)   08/07/2020 IGP, Aptima HPV DIAGNOSIS:: Comment (A) Recommendation:: Comment (A) Specimen adequacy:: Comment Clinician Provided ICD10: Comment Performed by:: Comment Electronically signed by:: Comment PAP Smear Comment: . PATHOLOGIST PROVIDED ICD10:: Comment Note:: Comment Test Methodology: Comment HPV Aptima: Negative     Health Maintenance Due  Topic Date Due    Hepatitis B Vaccines 19-59 Average Risk (1 of 3 - 19+ 3-dose series) Never done   HPV VACCINES (1 - 3-dose SCDM series) Never done   Mammogram  Never done   Influenza Vaccine  Never done   COVID-19 Vaccine (1 - 2025-26 season) Never done   Cervical Cancer Screening (Pap smear)  04/27/2024   The following portions of the patient's history were reviewed and updated as appropriate: allergies, current medications, past family history, past medical history, past social history, past surgical history and problem list. Problem list updated.  Objective:  There were no vitals filed for this visit.  Physical Exam Vitals and nursing note reviewed. Exam conducted with a chaperone present Kemp T).  Constitutional:      Appearance: Normal appearance.  HENT:     Head: Normocephalic and atraumatic.     Mouth/Throat:     Mouth: Mucous membranes are moist.     Pharynx: Oropharynx is clear. No oropharyngeal exudate or posterior oropharyngeal erythema.  Cardiovascular:     Heart sounds: Normal heart sounds, S1 normal and S2 normal.  Pulmonary:     Effort: Pulmonary effort is normal.     Breath sounds: Normal breath sounds.  Chest:  Breasts:    Tanner Score is 5.     Right: Normal.     Left: Normal.  Abdominal:     General: Abdomen is flat.     Palpations: There is no mass.     Tenderness: There is no abdominal tenderness. There is no rebound.  Genitourinary:    General: Normal vulva.     Exam position: Lithotomy position.     Pubic Area: No rash or pubic lice.      Labia:        Right: No rash or lesion.        Left: No rash or lesion.      Vagina: Normal. No vaginal discharge, erythema, bleeding or lesions.     Cervix: No cervical motion tenderness, discharge, friability, lesion or erythema.     Comments: IUD strings visualized Musculoskeletal:     Right lower leg: No edema.     Left lower leg: No edema.  Lymphadenopathy:     Head:     Right side of head: No preauricular or posterior  auricular adenopathy.     Left side of head: No preauricular or posterior auricular adenopathy.     Cervical: No cervical adenopathy.     Upper Body:     Right upper body: No supraclavicular or axillary adenopathy.     Left upper body: No supraclavicular or axillary adenopathy.  Skin:    General: Skin is warm and dry.     Findings: Lesion present. No rash.     Comments: Dry, flaky erythematous patches across entire body including face, genitals, breasts, back, legs and arms consistent with psoriasis   Neurological:     Mental Status: She is alert and oriented to person, place, and time.    Assessment and Plan:  Stacy Bauer is a 43 y.o. female [phone] presenting to the [facility] Department for an yearly wellness and contraception visit  Family planning  Contraception counseling:  Reviewed options based on patient desire and reproductive life plan. Patient is interested in IUD or IUS. This is in place and expires in January, 2026. Patient will return then to have it removed and replaced.  Risks, benefits, and typical effectiveness rates were reviewed.  Questions  were answered.  Written information was also given to the patient to review.    The patient will follow up in  2 months for surveillance.  The patient was told to call with any further questions, or with any concerns about this method of contraception.  Emphasized use of condoms 100% of the time for STI prevention.  Emergency Contraception Precautions (ECP): Patient assessed for need of ECP. She is not a candidate based on LARC in place and unexpired .    2. Elevated blood pressure reading  - BP 159/97, repeat 162/96. Some headaches this week that improve with OTC treatment. No blurred vision, chest pain, SOB or other alarm symptoms.  - No diagnosis of hypertension per patient, last year at [facility] it was 132/85 - Advised patient to seek evaluation at the [facility] and provided her with  information  4. At risk for sexually transmitted disease due to partner with genital symptoms  - She reports partner has had repeated episodes of a few (around 3) blister like bumps on his genitals that contain clear fluid and erupt, leave scars. - Discussed it may be genital herpes, but he needs evaluation when symptomatic so that the lesions can be swabbed - She has been sexually active with him x4 months, she has NOT had any genital symptoms - Discussed condom use (including with oral sex) to protect her from possible herpes exposure - Discussed he can come to [facility] for no cost STI testing and treatment  5. Screening for cervical cancer  - IGP, Aptima HPV  6. Encounter for screening mammogram for malignant neoplasm of breast  - Referral sent to [facility] for BCCCP program - Normal CBE today  7. Psoriasis  - Sees dermatology, having difficulty affording her injections that work very well - Topical treatments do not help as much - Reports she needs blood work to evaluate her kidney function to be able to continue injections, but injections and blood work are very expensive - Encouraged her to discuss with [facility] provider  Due to language barrier, a Spanish interpreter Kemp T.) was present in person during the history-taking, subsequent discussion, and physical exam with this patient.    Return in about 10 weeks (around 07/07/2024) for IUD replacement.   No future appointments.  Damien FORBES Satchel, NP

## 2024-05-02 LAB — IGP, APTIMA HPV
HPV Aptima: POSITIVE — AB
PAP Smear Comment: 0

## 2024-05-03 ENCOUNTER — Ambulatory Visit: Payer: Self-pay

## 2024-05-03 NOTE — Progress Notes (Signed)
 Contacted pt via pacific interpreters.  Confirmed no insurance, pt notified of results/recommendation and agreeable to bcccp services. Referral sent to [facility].

## 2024-05-03 NOTE — Progress Notes (Signed)
 Pap test was LSIL with positive HPV. 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 Per current ASCCP guidelines, she will need a repeat colposcopy as it has been <1 year since her last biopsy.  Attempted to call patient regarding these results and left a voicemail with a Spanish interpreter. She will need BCCCP program as she reported to me she no longer as insurance. BCCCP coordinator has also tried to contact her regarding her mammogram without success. If she is reached, please let her know she can receive both her colposcopy and mammogram via BCCCP.  Damien Satchel [facility]

## 2024-05-19 ENCOUNTER — Other Ambulatory Visit: Payer: Self-pay

## 2024-05-19 DIAGNOSIS — Z1231 Encounter for screening mammogram for malignant neoplasm of breast: Secondary | ICD-10-CM

## 2024-05-19 NOTE — Progress Notes (Unsigned)
 Stacy Bauer is a 43 y.o. female who presents to [facility] clinic today with {Blank single:19197::no complaints,complaint of} ***. Patient referred to BCCCP due to having an abnormal Pap smear 04/28/2024 that a colposcopy is recommended for follow up.   Pap Smear: Pap smear not completed today. Last Pap smear was 04/28/2024 at the [facility] Department clinic and was abnormal - LSIL with positive HPV. Per patient has {Blank single:19197::no history,history} of an abnormal Pap smear. Last Pap smear result is available in Epic.   Physical exam: Breasts Breasts symmetrical. No skin abnormalities bilateral breasts. No nipple retraction bilateral breasts. No nipple discharge bilateral breasts. No lymphadenopathy. No lumps palpated bilateral breasts. No complaints of pain or tenderness on exam.      Pelvic/Bimanual Pap is not indicated today per BCCCP guidelines.   Smoking History: Patient has {Blank single:19197::never smoked,is a former smoker,is a current smoker at *** packs per day} ***referred to quit line.    Patient Navigation: Patient education provided. Access to services provided for patient through COMCAST program. Spanish interpreter [name] from [facility] provided.   Breast and Cervical Cancer Risk Assessment: Patient {Blank single:19197::has,does not have} family history of breast cancer, known genetic mutations, or radiation treatment to the chest before age 40. Patient {Blank single:19197::has,does not have} history of cervical dysplasia, immunocompromised, or DES exposure in-utero.  Risk Assessment   No risk assessment data     A: BCCCP exam without pap smear Complaint of ***  P: Referred patient to the [facility] for a screening mammogram. Appointment scheduled ***.  Referred patient to [facility] for a colposcopy to follow up for her abnormal Pap smear. Appointment scheduled Wednesday, June 14, 2024  at 1355.  Driscilla Wanda SQUIBB, RN 05/19/2024 4:11 PM

## 2024-05-23 ENCOUNTER — Ambulatory Visit
Admission: RE | Admit: 2024-05-23 | Discharge: 2024-05-23 | Disposition: A | Discharge status: Home / Self Care | Payer: Self-pay | Source: Ambulatory Visit | Attending: Obstetrics and Gynecology | Admitting: Obstetrics and Gynecology

## 2024-05-23 ENCOUNTER — Ambulatory Visit: Payer: Self-pay | Attending: Obstetrics and Gynecology | Admitting: *Deleted

## 2024-05-23 VITALS — BP 175/95 | Wt 169.0 lb

## 2024-05-23 DIAGNOSIS — R87612 Low grade squamous intraepithelial lesion on cytologic smear of cervix (LGSIL): Secondary | ICD-10-CM

## 2024-05-23 DIAGNOSIS — Z1231 Encounter for screening mammogram for malignant neoplasm of breast: Secondary | ICD-10-CM | POA: Insufficient documentation

## 2024-05-23 DIAGNOSIS — Z1239 Encounter for other screening for malignant neoplasm of breast: Secondary | ICD-10-CM

## 2024-05-23 NOTE — Patient Instructions (Addendum)
 Explained breast self awareness with Stacy Bauer. Patient did not need a Pap smear today due to last Pap smear was 04/28/2024. Explained the colposcopy the recommended follow up for her abnormal Pap smear. Referred patient to the [facility] for a screening mammogram. Appointment scheduled Tuesday, May 23, 2024 at 0900. Referred patient to [facility] for a colposcopy to follow up for her abnormal Pap smear. Appointment scheduled Wednesday, June 14, 2024 at 1355. Patient aware of appointments and will be there. Let patient know Raymondo will follow up with her within the next couple weeks with results of her mammogram by letter or phone. Stacy Bauer verbalized understanding.  Darroll Bredeson, Wanda Ship, RN 8:49 AM

## 2024-05-29 ENCOUNTER — Other Ambulatory Visit: Payer: Self-pay | Admitting: Obstetrics and Gynecology

## 2024-05-29 DIAGNOSIS — R928 Other abnormal and inconclusive findings on diagnostic imaging of breast: Secondary | ICD-10-CM

## 2024-06-06 ENCOUNTER — Ambulatory Visit: Payer: Self-pay

## 2024-06-06 DIAGNOSIS — Z719 Counseling, unspecified: Secondary | ICD-10-CM

## 2024-06-06 DIAGNOSIS — Z23 Encounter for immunization: Secondary | ICD-10-CM

## 2024-06-06 NOTE — Progress Notes (Signed)
 In nurse clinic for immunizations for immigration. Pt needs Hep B,Tdap, MMR, Flu, Polio and Varicella. Pt qualified for state supply Twinrix,Tdap, MMR and varicella today. VIS given in spanish and copies of NCIR.Counseled pt on when needs to return for additional doses.Tolerated vaccines well.  Spanish interpreter present during visit -I.[email or web address]
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
## 2024-06-13 ENCOUNTER — Ambulatory Visit: Payer: Self-pay

## 2024-06-13 DIAGNOSIS — Z111 Encounter for screening for respiratory tuberculosis: Secondary | ICD-10-CM

## 2024-06-14 ENCOUNTER — Other Ambulatory Visit (HOSPITAL_COMMUNITY)
Admission: RE | Admit: 2024-06-14 | Discharge: 2024-06-14 | Disposition: A | Payer: Self-pay | Source: Ambulatory Visit | Attending: Obstetrics | Admitting: Obstetrics

## 2024-06-14 ENCOUNTER — Ambulatory Visit: Payer: Self-pay | Admitting: Obstetrics

## 2024-06-14 VITALS — BP 138/84 | HR 78 | Wt 173.9 lb

## 2024-06-14 DIAGNOSIS — N870 Mild cervical dysplasia: Secondary | ICD-10-CM

## 2024-06-14 DIAGNOSIS — R8789 Other abnormal findings in specimens from female genital organs: Secondary | ICD-10-CM | POA: Insufficient documentation

## 2024-06-14 DIAGNOSIS — Z23 Encounter for immunization: Secondary | ICD-10-CM

## 2024-06-14 DIAGNOSIS — Z3202 Encounter for pregnancy test, result negative: Secondary | ICD-10-CM

## 2024-06-14 DIAGNOSIS — R87612 Low grade squamous intraepithelial lesion on cytologic smear of cervix (LGSIL): Secondary | ICD-10-CM

## 2024-06-14 DIAGNOSIS — Z01812 Encounter for preprocedural laboratory examination: Secondary | ICD-10-CM

## 2024-06-14 DIAGNOSIS — R87622 Low grade squamous intraepithelial lesion on cytologic smear of vagina (LGSIL): Secondary | ICD-10-CM | POA: Insufficient documentation

## 2024-06-14 LAB — POCT URINE PREGNANCY: Preg Test, Ur: NEGATIVE

## 2024-06-14 NOTE — Patient Instructions (Signed)
 Human Papillomavirus (HPV) Vaccine Injection Qu es este medicamento? La VACUNA CONTRA EL VIRUS DEL PAPILOMA HUMANO reduce el riesgo de contraer el virus del Geneticist, molecular (VPH). No trata la infeccin por VPH. Contina siendo posible Primary school teacher VPH despus de recibir Designer, television/film set, pero los sntomas podran ser menos graves o durar menos [place]. [place] al sistema inmunolgico a aprender a Industrial/product designer una futura infeccin. Este medicamento puede ser utilizado para otros usos; si tiene alguna pregunta consulte con su proveedor de atencin mdica o con su farmacutico. MARCAS COMUNES: Gardasil 9 Qu le debo informar a mi profesional de la salud antes de tomar este medicamento? Necesitan saber si usted presenta alguno de los siguientes problemas o situaciones: Fiebre Hemofilia VIH o SIDA Problemas del sistema inmunolgico Infeccin Cantidad baja de plaquetas Una reaccin inusual a la vacuna contra el virus del papiloma humano, a la levadura, a otras vacunas, a otros medicamentos, alimentos, colorantes o conservantes Si est embarazada o buscando quedar embarazada Si est amamantando a un beb Cmo debo [employer]? Esta vacuna se inyecta en un msculo. Lo administra su equipo de atencin. Se requieren 2 o 3 dosis de esta vacuna para obtener todos los beneficios. Establezca un recordatorio para la fecha en que debe aplicarse la segunda dosis. Recibir una copia de informacin escrita sobre la vacuna antes de cada vacuna. Asegrese de leer esta informacin cada vez cuidadosamente. Esta informacin puede cambiar frecuentemente. Hable con su equipo de atencin sobre el uso de este medicamento en nios. Aunque se puede recetar a nios tan pequeos como de 9 aos de edad con ciertas afecciones, existen precauciones que deben tomarse. Sobredosis: Pngase en contacto inmediatamente con un centro toxicolgico o una sala de urgencia si usted cree que haya tomado demasiado  medicamento.<br>ATENCIN: [employer] es solo para usted. No comparta este medicamento con nadie. Qu sucede si me olvido de una dosis? Cumpla con las citas para dosis de seguimiento segn las instrucciones. Es importante no olvidar ninguna dosis. Llame a su equipo de atencin si no puede asistir a una cita. Qu puede interactuar con este medicamento? Ciertos medicamentos para la artritis Medicamentos para un trasplante de rganos Medicamentos para tratar Buyer, retail esteroideos, tales como la prednisona o la cortisona Puede ser que esta lista no menciona todas las posibles interacciones. Informe a su profesional de [employer] de [employer] productos a base de hierbas, medicamentos de [place] o suplementos nutritivos que est tomando. Si usted fuma, consume bebidas alcohlicas o si utiliza drogas ilegales, indqueselo tambin a su profesional de [employer]. Algunas sustancias pueden interactuar con su medicamento. A qu debo estar atento al usar [employer]? Visite peridicamente a su equipo de atencin. Informe inmediatamente cualquier efecto secundario a su equipo de atencin. Es posible que esta vacuna, como todas las vacunas, no proteja completamente a todos. Qu efectos secundarios puedo tener al [employer] este medicamento? Efectos secundarios que debe informar a su equipo de atencin tan pronto como sea posible: Reacciones alrgicas: erupcin cutnea, comezn/picazn, urticaria, hinchazn de la cara, los labios, la lengua o la garganta Sensacin de [place] o aturdimiento Efectos secundarios que generalmente no requieren atencin mdica (debe informarlos a su equipo de atencin si persisten o si son molestos): Diarrea Mareos Fatiga Teacher, English as a foreign language Dolor de cabeza Nuseas Dolor, enrojecimiento, irritacin o Counsellor de la inyeccin Puede ser que esta lista no menciona todos los posibles efectos secundarios. Comunquese a su mdico por asesoramiento mdico [employer]. Usted puede informar  los efectos secundarios a la FDA por telfono al 1-800-FDA-1088. Dnde debo guardar mi medicina? Esta vacuna la administra solamente su equipo de atencin. No se guarda en su casa. ATENCIN: Este folleto es un resumen. Puede ser que no cubra toda la posible informacin. Si usted tiene preguntas acerca de esta medicina, consulte con su mdico, su farmacutico o su profesional de Radiographer, therapeutic.  2024 Elsevier/Gold Standard (2022-05-06 00:00:00)

## 2024-06-15 ENCOUNTER — Other Ambulatory Visit: Payer: Self-pay

## 2024-06-17 LAB — SURGICAL PATHOLOGY

## 2024-06-26 ENCOUNTER — Ambulatory Visit
Admission: RE | Admit: 2024-06-26 | Discharge: 2024-06-26 | Disposition: A | Discharge status: Home / Self Care | Payer: Self-pay | Source: Ambulatory Visit | Attending: Obstetrics and Gynecology | Admitting: Obstetrics and Gynecology

## 2024-06-26 ENCOUNTER — Ambulatory Visit: Payer: Self-pay | Admitting: Obstetrics and Gynecology

## 2024-06-26 DIAGNOSIS — R928 Other abnormal and inconclusive findings on diagnostic imaging of breast: Secondary | ICD-10-CM | POA: Insufficient documentation

## 2024-07-05 ENCOUNTER — Ambulatory Visit: Payer: Self-pay | Admitting: Obstetrics

## 2024-08-15 ENCOUNTER — Ambulatory Visit: Payer: Self-pay

## 2024-12-13 ENCOUNTER — Ambulatory Visit: Payer: Self-pay
